# Patient Record
Sex: Female | Born: 1955 | Race: White | Hispanic: No | Marital: Married | Smoking: Current every day smoker
Health system: Southern US, Community
[De-identification: ages and names within clinical notes are randomized; demographics above are authoritative.]

## PROBLEM LIST (undated history)

## (undated) DIAGNOSIS — I10 Essential (primary) hypertension: Secondary | ICD-10-CM

## (undated) DIAGNOSIS — M1990 Unspecified osteoarthritis, unspecified site: Secondary | ICD-10-CM

## (undated) DIAGNOSIS — Z972 Presence of dental prosthetic device (complete) (partial): Secondary | ICD-10-CM

## (undated) DIAGNOSIS — K219 Gastro-esophageal reflux disease without esophagitis: Secondary | ICD-10-CM

## (undated) DIAGNOSIS — R112 Nausea with vomiting, unspecified: Secondary | ICD-10-CM

## (undated) DIAGNOSIS — F419 Anxiety disorder, unspecified: Secondary | ICD-10-CM

## (undated) DIAGNOSIS — Z9889 Other specified postprocedural states: Secondary | ICD-10-CM

## (undated) DIAGNOSIS — J189 Pneumonia, unspecified organism: Secondary | ICD-10-CM

## (undated) DIAGNOSIS — Z973 Presence of spectacles and contact lenses: Secondary | ICD-10-CM

## (undated) HISTORY — PX: ABDOMINAL HYSTERECTOMY: SHX81

## (undated) HISTORY — PX: DILATION AND CURETTAGE OF UTERUS: SHX78

## (undated) HISTORY — PX: TUBAL LIGATION: SHX77

## (undated) HISTORY — PX: OTHER SURGICAL HISTORY: SHX169

---

## 2005-08-15 ENCOUNTER — Other Ambulatory Visit: Payer: Self-pay

## 2005-08-15 ENCOUNTER — Emergency Department: Payer: Self-pay | Admitting: Unknown Physician Specialty

## 2005-08-28 ENCOUNTER — Ambulatory Visit: Payer: Self-pay | Admitting: Internal Medicine

## 2005-08-28 ENCOUNTER — Ambulatory Visit: Payer: Self-pay | Admitting: Unknown Physician Specialty

## 2006-08-23 ENCOUNTER — Ambulatory Visit: Payer: Self-pay | Admitting: Internal Medicine

## 2006-09-10 ENCOUNTER — Ambulatory Visit: Payer: Self-pay | Admitting: Internal Medicine

## 2006-09-13 ENCOUNTER — Ambulatory Visit: Payer: Self-pay | Admitting: Internal Medicine

## 2007-09-17 ENCOUNTER — Ambulatory Visit: Payer: Self-pay | Admitting: General Surgery

## 2009-09-16 ENCOUNTER — Ambulatory Visit: Payer: Self-pay | Admitting: Internal Medicine

## 2009-09-21 ENCOUNTER — Ambulatory Visit: Payer: Self-pay | Admitting: Internal Medicine

## 2011-06-22 ENCOUNTER — Ambulatory Visit: Payer: Self-pay

## 2011-09-06 ENCOUNTER — Ambulatory Visit: Payer: Self-pay | Admitting: Medical

## 2014-01-29 ENCOUNTER — Ambulatory Visit: Payer: Self-pay | Admitting: Internal Medicine

## 2016-04-26 DIAGNOSIS — M6240 Contracture of muscle, unspecified site: Secondary | ICD-10-CM | POA: Diagnosis not present

## 2016-04-26 DIAGNOSIS — K820 Obstruction of gallbladder: Secondary | ICD-10-CM | POA: Diagnosis not present

## 2016-04-26 DIAGNOSIS — J399 Disease of upper respiratory tract, unspecified: Secondary | ICD-10-CM | POA: Diagnosis not present

## 2016-08-09 DIAGNOSIS — K219 Gastro-esophageal reflux disease without esophagitis: Secondary | ICD-10-CM | POA: Diagnosis not present

## 2016-08-09 DIAGNOSIS — G629 Polyneuropathy, unspecified: Secondary | ICD-10-CM | POA: Diagnosis not present

## 2016-08-09 DIAGNOSIS — Z87891 Personal history of nicotine dependence: Secondary | ICD-10-CM | POA: Diagnosis not present

## 2016-09-13 DIAGNOSIS — K2960 Other gastritis without bleeding: Secondary | ICD-10-CM | POA: Diagnosis not present

## 2016-09-13 DIAGNOSIS — Z87891 Personal history of nicotine dependence: Secondary | ICD-10-CM | POA: Diagnosis not present

## 2016-12-13 DIAGNOSIS — K2960 Other gastritis without bleeding: Secondary | ICD-10-CM | POA: Diagnosis not present

## 2016-12-13 DIAGNOSIS — H6690 Otitis media, unspecified, unspecified ear: Secondary | ICD-10-CM | POA: Diagnosis not present

## 2017-10-03 DIAGNOSIS — M79604 Pain in right leg: Secondary | ICD-10-CM | POA: Diagnosis not present

## 2017-10-03 DIAGNOSIS — M79605 Pain in left leg: Secondary | ICD-10-CM | POA: Diagnosis not present

## 2017-10-03 DIAGNOSIS — G2581 Restless legs syndrome: Secondary | ICD-10-CM | POA: Diagnosis not present

## 2017-10-18 DIAGNOSIS — G2581 Restless legs syndrome: Secondary | ICD-10-CM | POA: Diagnosis not present

## 2017-10-18 DIAGNOSIS — M79605 Pain in left leg: Secondary | ICD-10-CM | POA: Diagnosis not present

## 2017-10-18 DIAGNOSIS — M79604 Pain in right leg: Secondary | ICD-10-CM | POA: Diagnosis not present

## 2017-10-29 DIAGNOSIS — J0100 Acute maxillary sinusitis, unspecified: Secondary | ICD-10-CM | POA: Diagnosis not present

## 2017-10-29 DIAGNOSIS — J069 Acute upper respiratory infection, unspecified: Secondary | ICD-10-CM | POA: Diagnosis not present

## 2017-10-29 DIAGNOSIS — R05 Cough: Secondary | ICD-10-CM | POA: Diagnosis not present

## 2017-11-05 DIAGNOSIS — M79604 Pain in right leg: Secondary | ICD-10-CM | POA: Diagnosis not present

## 2017-11-05 DIAGNOSIS — G2581 Restless legs syndrome: Secondary | ICD-10-CM | POA: Diagnosis not present

## 2017-11-05 DIAGNOSIS — M79605 Pain in left leg: Secondary | ICD-10-CM | POA: Diagnosis not present

## 2018-03-17 DIAGNOSIS — Z87891 Personal history of nicotine dependence: Secondary | ICD-10-CM | POA: Diagnosis not present

## 2018-03-17 DIAGNOSIS — E669 Obesity, unspecified: Secondary | ICD-10-CM | POA: Diagnosis not present

## 2018-03-17 DIAGNOSIS — J019 Acute sinusitis, unspecified: Secondary | ICD-10-CM | POA: Diagnosis not present

## 2019-06-19 ENCOUNTER — Encounter: Payer: Self-pay | Admitting: Internal Medicine

## 2019-06-19 ENCOUNTER — Other Ambulatory Visit: Payer: Self-pay

## 2019-06-19 ENCOUNTER — Ambulatory Visit (INDEPENDENT_AMBULATORY_CARE_PROVIDER_SITE_OTHER): Payer: 59 | Admitting: Internal Medicine

## 2019-06-19 VITALS — BP 151/78 | HR 90 | Wt 155.5 lb

## 2019-06-19 DIAGNOSIS — F419 Anxiety disorder, unspecified: Secondary | ICD-10-CM | POA: Diagnosis not present

## 2019-06-19 DIAGNOSIS — K2100 Gastro-esophageal reflux disease with esophagitis, without bleeding: Secondary | ICD-10-CM | POA: Diagnosis not present

## 2019-06-19 MED ORDER — CLONAZEPAM 0.5 MG PO TABS: 0.5000 mg | ORAL_TABLET | Freq: Two times a day (BID) | ORAL | 0 refills | Status: DC | PRN

## 2019-06-19 NOTE — Progress Notes (Addendum)
Established Patient Office Visit  Subjective:  Patient ID: Caitlin Oneal, female    DOB: 1955/11/12  Age: 64 y.o. MRN: [id number]  CC:  Chief Complaint  Patient presents with  . Abdominal Pain    Pt feels stretching and burning in her abdomen usually when she coughs and sneezes    Abdominal Pain This is a recurrent problem. The current episode started 1 to 4 weeks ago. The onset quality is undetermined. The problem occurs daily. The problem has been waxing and waning. The pain is located in the epigastric region. The pain is at a severity of 6/10. Pertinent negatives include no anorexia or arthralgias. The pain is aggravated by belching. The pain is relieved by nothing.    Caitlin Oneal presents for abdominal pain  History reviewed. No pertinent past medical history.  History reviewed. No pertinent surgical history.  History reviewed. No pertinent family history.  Social History   Socioeconomic History  . Marital status: Married    Spouse name: Not on file  . Number of children: Not on file  . Years of education: Not on file  . Highest education level: Not on file  Occupational History  . Not on file  Tobacco Use  . Smoking status: Current Every Day Smoker  . Smokeless tobacco: Current User  Substance and Sexual Activity  . Alcohol use: Not on file  . Drug use: Not on file  . Sexual activity: Not on file  Other Topics Concern  . Not on file  Social History Narrative  . Not on file   Social Determinants of Health   Financial Resource Strain:   . Difficulty of Paying Living Expenses:   Food Insecurity:   . Worried About Charity fundraiser in the Last Year:   . Arboriculturist in the Last Year:   Transportation Needs:   . Film/video editor (Medical):   Marland Kitchen Lack of Transportation (Non-Medical):   Physical Activity:   . Days of Exercise per Week:   . Minutes of Exercise per Session:   Stress:   . Feeling of Stress :   Social Connections:   . Frequency of  Communication with Friends and Family:   . Frequency of Social Gatherings with Friends and Family:   . Attends Religious Services:   . Active Member of Clubs or Organizations:   . Attends Archivist Meetings:   Marland Kitchen Marital Status:   Intimate Partner Violence:   . Fear of Current or Ex-Partner:   . Emotionally Abused:   Marland Kitchen Physically Abused:   . Sexually Abused:      Current Outpatient Medications:  .  aspirin EC 81 MG tablet, Take 81 mg by mouth daily., Disp: , Rfl:  .  clonazePAM (KLONOPIN) 0.5 MG tablet, Take 1 tablet (0.5 mg total) by mouth 2 (two) times daily as needed for anxiety., Disp: 60 tablet, Rfl: 0 .  gabapentin (NEURONTIN) 100 MG capsule, Take 100 mg by mouth 3 (three) times daily as needed., Disp: , Rfl:  .  Methylcobalamin (B12-ACTIVE PO), Take 1 tablet by mouth daily., Disp: , Rfl:    Allergies  Allergen Reactions  . Codeine     ROS Review of Systems  Constitutional: Negative for appetite change.  HENT: Negative.   Eyes: Negative.   Respiratory: Negative.   Cardiovascular: Negative.   Gastrointestinal: Positive for abdominal pain. Negative for anorexia.  Genitourinary: Negative.   Musculoskeletal: Negative for arthralgias.  Neurological: Negative.  Objective:    Physical Exam  Constitutional: She appears well-developed and well-nourished.  HENT:  Head: Normocephalic and atraumatic.  Eyes: Pupils are equal, round, and reactive to light.  Cardiovascular: Normal rate.  Abdominal: Soft. Bowel sounds are normal. There is no abdominal tenderness. There is no rebound.  Musculoskeletal:        General: No edema.     Cervical back: Normal range of motion and neck supple.  Skin: No erythema.    BP (!) 151/78   Pulse 90   Wt 155 lb 8 oz (70.5 kg)  Wt Readings from Last 3 Encounters:  06/19/19 155 lb 8 oz (70.5 kg)     Health Maintenance Due  Topic Date Due  . Hepatitis C Screening  Never done  . HIV Screening  Never done  .  TETANUS/TDAP  Never done  . PAP SMEAR-Modifier  Never done  . MAMMOGRAM  Never done  . COLONOSCOPY  Never done    There are no preventive care reminders to display for this patient.  No results found for: TSH No results found for: WBC, HGB, HCT, MCV, PLT No results found for: NA, K, CHLORIDE, CO2, GLUCOSE, BUN, CREATININE, BILITOT, ALKPHOS, AST, ALT, PROT, ALBUMIN, CALCIUM, ANIONGAP, EGFR, GFR No results found for: CHOL No results found for: HDL No results found for: LDLCALC No results found for: TRIG No results found for: CHOLHDL No results found for: HGBA1C    Assessment & Plan:   Problem List Items Addressed This Visit      Digestive   Gastroesophageal reflux disease with esophagitis without hemorrhage    Complaining of recurrent abdominal pain.  She is taking Prilosec twice a day I told her to take the evening Prilosec daily.  she should take her evening Prilosec before her evening meal avoid fatty meal.  Also she was advised to take Mylanta as needed        Other   Anxiety - Primary    This is a chronic problem but stable at the present time         Meds ordered this encounter  Medications  . clonazePAM (KLONOPIN) 0.5 MG tablet    Sig: Take 1 tablet (0.5 mg total) by mouth 2 (two) times daily as needed for anxiety.    Dispense:  60 tablet    Refill:  0   .jm Follow-up: Return in about 2 months (around 08/19/2019).    Cletis Athens, MD

## 2019-06-19 NOTE — Assessment & Plan Note (Signed)
Complaining of recurrent abdominal pain.  She is taking Prilosec twice a day I told her to take the evening Prilosec daily.  she should take her evening Prilosec before her evening meal avoid fatty meal.  Also she was advised to take Mylanta as needed

## 2019-06-23 NOTE — Assessment & Plan Note (Signed)
This is a chronic problem but stable at the present time

## 2019-06-26 ENCOUNTER — Telehealth: Payer: Self-pay | Admitting: *Deleted

## 2019-06-26 DIAGNOSIS — K2100 Gastro-esophageal reflux disease with esophagitis, without bleeding: Secondary | ICD-10-CM

## 2019-06-26 NOTE — Telephone Encounter (Signed)
Patient was seen in the office last week with bloating and burning in the abdomen and was told to call back if she didn't start feeling any better and we would put in referral for GI. I have placed the order for the referral and patient has been notified.

## 2019-07-16 ENCOUNTER — Other Ambulatory Visit: Payer: Self-pay

## 2019-07-16 ENCOUNTER — Ambulatory Visit (INDEPENDENT_AMBULATORY_CARE_PROVIDER_SITE_OTHER): Payer: 59 | Admitting: Gastroenterology

## 2019-07-16 ENCOUNTER — Encounter: Payer: Self-pay | Admitting: Gastroenterology

## 2019-07-16 VITALS — BP 157/51 | HR 94 | Temp 96.9°F | Ht 68.0 in | Wt 155.4 lb

## 2019-07-16 DIAGNOSIS — K2100 Gastro-esophageal reflux disease with esophagitis, without bleeding: Secondary | ICD-10-CM | POA: Diagnosis not present

## 2019-07-16 DIAGNOSIS — R1033 Periumbilical pain: Secondary | ICD-10-CM

## 2019-07-16 DIAGNOSIS — Z86010 Personal history of colonic polyps: Secondary | ICD-10-CM

## 2019-07-16 NOTE — Progress Notes (Signed)
Gastroenterology Consultation  Referring Provider:     Corky Downs, MD Primary Care Physician:  Corky Downs, MD Primary Gastroenterologist:  Dr. Servando Snare     Reason for Consultation:     Abdominal pain        HPI:   Caitlin Oneal is a 64 y.o. y/o female referred for consultation & management of abdominal pain by Dr. Corky Downs, MD.  This patient comes in today with a history of having an upper endoscopy back in 1999 at [facility] for noncardiac chest pain.  At that time the patient had biopsies of the esophagus that showed reflux.  The patient was seen at the beginning of June by her primary care provider and at that time the patient had reported a few weeks of epigastric discomfort that was off and on and was rated as a 6 out of 10.  The pain was described as burning in nature. The patient reports that her abdominal pain started a few months ago when she was straining to have a bowel movement.  The pain is worse with eating but she does not report any particular food make it worse.  She also has bloating associated with the pain.  There is no report of any unexplained weight loss nausea vomiting fevers or chills.  The patient has been on a PPI and states that as long as she takes her medication she does not suffer from any heartburn. She reports the pain to be in the right lower abdomen adjacent to her umbilicus.  She also reports that the characteristics of the pain is a burning pain. The patient also reports that she has had longstanding back pain.  The patient had a CT scan of the abdomen in September 2011 for similar pain without any source of the pain seen at that time.  She did have a ventral hernia which contains fat in the area superior to the umbilicus that time.  History reviewed. No pertinent past medical history.  History reviewed. No pertinent surgical history.  Prior to Admission medications   Medication Sig Start Date End Date Taking? Authorizing Provider  aspirin EC 81 MG tablet  Take 81 mg by mouth daily.    [provider]  clonazePAM (KLONOPIN) 0.5 MG tablet Take 1 tablet (0.5 mg total) by mouth 2 (two) times daily as needed for anxiety. 06/19/19   Corky Downs, MD  gabapentin (NEURONTIN) 100 MG capsule Take 100 mg by mouth 3 (three) times daily as needed. 05/15/19   [provider]  Methylcobalamin (B12-ACTIVE PO) Take 1 tablet by mouth daily.    [provider]  omeprazole (PRILOSEC) 20 MG capsule Take 20 mg by mouth 2 (two) times daily. 07/09/19   [provider]    History reviewed. No pertinent family history.   Social History   Tobacco Use  . Smoking status: Current Every Day Smoker  . Smokeless tobacco: Current User  Substance Use Topics  . Alcohol use: Not on file  . Drug use: Not on file    Allergies as of 07/16/2019 - Review Complete 07/16/2019  Allergen Reaction Noted  . Codeine  06/19/2019    Review of Systems:    All systems reviewed and negative except where noted in HPI.   Physical Exam:  BP (!) 157/51   Pulse 94   Temp (!) 96.9 F (36.1 C) (Temporal)   Ht 5\' 8"  (1.727 m)   Wt 155 lb 6.4 oz (70.5 kg)   BMI 23.63  kg/m  No LMP recorded. General:   Alert,  Well-developed, well-nourished, pleasant and cooperative in NAD Head:  Normocephalic and atraumatic. Eyes:  Sclera clear, no icterus.   Conjunctiva pink. Ears:  Normal auditory acuity. Neck:  Supple; no masses or thyromegaly. Lungs:  Respirations even and unlabored.  Clear throughout to auscultation.   No wheezes, crackles, or rhonchi. No acute distress. Heart:  Regular rate and rhythm; no murmurs, clicks, rubs, or gallops. Abdomen:  Normal bowel sounds.  No bruits.  Soft, positive tenderness to palpation while flexing the abdominal wall muscles and non-distended without masses or hepatosplenomegaly.  A periumbilical hernia was noted.  No guarding or rebound tenderness.  Positive Carnett sign.   Rectal:  Deferred.  Pulses:  Normal pulses  noted. Extremities:  No clubbing or edema.  No cyanosis. Neurologic:  Alert and oriented x3;  grossly normal neurologically. Skin:  Intact without significant lesions or rashes.  No jaundice. Lymph Nodes:  No significant cervical adenopathy. Psych:  Alert and cooperative. Normal mood and affect.  Imaging Studies: No results found.  Assessment and Plan:   Caitlin Oneal is a 64 y.o. y/o female comes in today with a history of a colon polyp and she reports that that was done at [facility] endoscopy center and she was told that she had polyps and would need a repeat colonoscopy in 5 years.  The patient will be set up for repeat colonoscopy.  The patient reports that her PPI is helping her heartburn and she is having no further symptoms of heartburn and she has been encouraged to continue taking her PPI.  As for the abdominal pain on physical exam it is consistent with musculoskeletal pain with exacerbation of the abdominal discomfort with raising the leg 6 inches above the exam table.  The patient has been explained this and has been told to use warm compresses to the area with massaging the area.  The patient has also been told that this musculoskeletal pain is more common in people with chronic back pain which she has.  The patient has been explained the plan and agrees with it and will follow the time of the colonoscopy.    Midge Minium, MD. Clementeen Graham    Note: This dictation was prepared with Dragon dictation along with smaller phrase technology. Any transcriptional errors that result from this process are unintentional.

## 2019-08-20 ENCOUNTER — Ambulatory Visit: Payer: 59 | Admitting: Internal Medicine

## 2019-08-25 ENCOUNTER — Other Ambulatory Visit: Payer: Self-pay

## 2019-08-25 ENCOUNTER — Encounter: Payer: Self-pay | Admitting: Gastroenterology

## 2019-08-27 ENCOUNTER — Other Ambulatory Visit
Admission: RE | Admit: 2019-08-27 | Discharge: 2019-08-27 | Disposition: A | Discharge status: Home / Self Care | Payer: 59 | Source: Ambulatory Visit | Attending: Gastroenterology | Admitting: Gastroenterology

## 2019-08-27 ENCOUNTER — Other Ambulatory Visit: Payer: Self-pay

## 2019-08-27 DIAGNOSIS — Z01812 Encounter for preprocedural laboratory examination: Secondary | ICD-10-CM | POA: Insufficient documentation

## 2019-08-27 DIAGNOSIS — Z20822 Contact with and (suspected) exposure to covid-19: Secondary | ICD-10-CM | POA: Insufficient documentation

## 2019-08-27 LAB — SARS CORONAVIRUS 2 (TAT 6-24 HRS): SARS Coronavirus 2: NEGATIVE

## 2019-08-28 NOTE — Anesthesia Preprocedure Evaluation (Addendum)
Anesthesia Evaluation  Patient identified by MRN, date of birth, ID band Patient awake    Reviewed: Allergy & Precautions, NPO status , Patient's Chart, lab work & pertinent test results, reviewed documented beta blocker date and time   History of Anesthesia Complications (+) PONV and history of anesthetic complications  Airway Mallampati: III  TM Distance: >3 FB Neck ROM: Full    Dental  (+) Upper Dentures, Partial Lower   Pulmonary Current SmokerPatient did not abstain from smoking.,    breath sounds clear to auscultation       Cardiovascular (-) angina(-) DOE  Rhythm:Regular Rate:Normal     Neuro/Psych Anxiety    GI/Hepatic GERD  Medicated and Controlled,  Endo/Other    Renal/GU      Musculoskeletal   Abdominal   Peds  Hematology   Anesthesia Other Findings   Reproductive/Obstetrics                            Anesthesia Physical Anesthesia Plan  ASA: II  Anesthesia Plan: General   Post-op Pain Management:    Induction: Intravenous  PONV Risk Score and Plan: 3 and Propofol infusion, TIVA, Treatment may vary due to age or medical condition and Ondansetron  Airway Management Planned: Natural Airway and Nasal Cannula  Additional Equipment:   Intra-op Plan:   Post-operative Plan:   Informed Consent: I have reviewed the patients History and Physical, chart, labs and discussed the procedure including the risks, benefits and alternatives for the proposed anesthesia with the patient or authorized representative who has indicated his/her understanding and acceptance.       Plan Discussed with: CRNA and Anesthesiologist  Anesthesia Plan Comments:         Anesthesia Quick Evaluation

## 2019-08-28 NOTE — Discharge Instructions (Signed)
General Anesthesia, Adult, Care After This sheet gives you information about how to care for yourself after your procedure. Your health care provider may also give you more specific instructions. If you have problems or questions, contact your health care provider. What can I expect after the procedure? After the procedure, the following side effects are common:  Pain or discomfort at the IV site.  Nausea.  Vomiting.  Sore throat.  Trouble concentrating.  Feeling cold or chills.  Weak or tired.  Sleepiness and fatigue.  Soreness and body aches. These side effects can affect parts of the body that were not involved in surgery. Follow these instructions at home:  For at least 24 hours after the procedure:  Have a responsible adult stay with you. It is important to have someone help care for you until you are awake and alert.  Rest as needed.  Do not: ? Participate in activities in which you could fall or become injured. ? Drive. ? Use heavy machinery. ? Drink alcohol. ? Take sleeping pills or medicines that cause drowsiness. ? Make important decisions or sign legal documents. ? Take care of children on your own. Eating and drinking  Follow any instructions from your health care provider about eating or drinking restrictions.  When you feel hungry, start by eating small amounts of foods that are soft and easy to digest (bland), such as toast. Gradually return to your regular diet.  Drink enough fluid to keep your urine pale yellow.  If you vomit, rehydrate by drinking water, juice, or clear broth. General instructions  If you have sleep apnea, surgery and certain medicines can increase your risk for breathing problems. Follow instructions from your health care provider about wearing your sleep device: ? Anytime you are sleeping, including during daytime naps. ? While taking prescription pain medicines, sleeping medicines, or medicines that make you drowsy.  Return to  your normal activities as told by your health care provider. Ask your health care provider what activities are safe for you.  Take over-the-counter and prescription medicines only as told by your health care provider.  If you smoke, do not smoke without supervision.  Keep all follow-up visits as told by your health care provider. This is important. Contact a health care provider if:  You have nausea or vomiting that does not get better with medicine.  You cannot eat or drink without vomiting.  You have pain that does not get better with medicine.  You are unable to pass urine.  You develop a skin rash.  You have a fever.  You have redness around your IV site that gets worse. Get help right away if:  You have difficulty breathing.  You have chest pain.  You have blood in your urine or stool, or you vomit blood. Summary  After the procedure, it is common to have a sore throat or nausea. It is also common to feel tired.  Have a responsible adult stay with you for the first 24 hours after general anesthesia. It is important to have someone help care for you until you are awake and alert.  When you feel hungry, start by eating small amounts of foods that are soft and easy to digest (bland), such as toast. Gradually return to your regular diet.  Drink enough fluid to keep your urine pale yellow.  Return to your normal activities as told by your health care provider. Ask your health care provider what activities are safe for you. This information is not   intended to replace advice given to you by your health care provider. Make sure you discuss any questions you have with your health care provider. Document Revised: 01/04/2017 Document Reviewed: 08/17/2016 Elsevier Patient Education  2020 Elsevier Inc[employer]  

## 2019-08-31 ENCOUNTER — Ambulatory Visit
Admission: RE | Admit: 2019-08-31 | Discharge: 2019-08-31 | Disposition: A | Discharge status: Home / Self Care | Payer: 59 | Attending: Gastroenterology | Admitting: Gastroenterology

## 2019-08-31 ENCOUNTER — Ambulatory Visit: Payer: 59 | Admitting: Anesthesiology

## 2019-08-31 ENCOUNTER — Other Ambulatory Visit: Payer: Self-pay | Admitting: Gastroenterology

## 2019-08-31 ENCOUNTER — Encounter
Admission: RE | Disposition: A | Discharge status: Home / Self Care | Payer: Self-pay | Source: Home / Self Care | Attending: Gastroenterology

## 2019-08-31 ENCOUNTER — Encounter: Payer: Self-pay | Admitting: Gastroenterology

## 2019-08-31 ENCOUNTER — Other Ambulatory Visit: Payer: Self-pay

## 2019-08-31 DIAGNOSIS — K640 First degree hemorrhoids: Secondary | ICD-10-CM | POA: Insufficient documentation

## 2019-08-31 DIAGNOSIS — Z860100 Personal history of colon polyps, unspecified: Secondary | ICD-10-CM

## 2019-08-31 DIAGNOSIS — K635 Polyp of colon: Secondary | ICD-10-CM

## 2019-08-31 DIAGNOSIS — F17210 Nicotine dependence, cigarettes, uncomplicated: Secondary | ICD-10-CM | POA: Diagnosis not present

## 2019-08-31 DIAGNOSIS — K219 Gastro-esophageal reflux disease without esophagitis: Secondary | ICD-10-CM | POA: Insufficient documentation

## 2019-08-31 DIAGNOSIS — K648 Other hemorrhoids: Secondary | ICD-10-CM

## 2019-08-31 DIAGNOSIS — Z1211 Encounter for screening for malignant neoplasm of colon: Secondary | ICD-10-CM | POA: Diagnosis present

## 2019-08-31 DIAGNOSIS — F419 Anxiety disorder, unspecified: Secondary | ICD-10-CM | POA: Diagnosis not present

## 2019-08-31 HISTORY — DX: Gastro-esophageal reflux disease without esophagitis: K21.9

## 2019-08-31 HISTORY — DX: Other specified postprocedural states: Z98.890

## 2019-08-31 HISTORY — PX: COLONOSCOPY WITH PROPOFOL: SHX5780

## 2019-08-31 HISTORY — PX: POLYPECTOMY: SHX5525

## 2019-08-31 HISTORY — DX: Presence of spectacles and contact lenses: Z97.3

## 2019-08-31 HISTORY — DX: Presence of dental prosthetic device (complete) (partial): Z97.2

## 2019-08-31 HISTORY — DX: Nausea with vomiting, unspecified: R11.2

## 2019-08-31 SURGERY — COLONOSCOPY WITH PROPOFOL
Anesthesia: General | Site: Rectum

## 2019-08-31 MED ORDER — SODIUM CHLORIDE 0.9 % IV SOLN: INTRAVENOUS | Status: DC

## 2019-08-31 MED ORDER — PROPOFOL 10 MG/ML IV BOLUS
INTRAVENOUS | Status: DC | PRN
  Administered 2019-08-31: 50 mg via INTRAVENOUS
  Administered 2019-08-31 (×2): 30 mg via INTRAVENOUS
  Administered 2019-08-31: 150 mg via INTRAVENOUS
  Administered 2019-08-31 (×2): 30 mg via INTRAVENOUS

## 2019-08-31 MED ORDER — ONDANSETRON HCL 4 MG/2ML IJ SOLN: 4.0000 mg | Freq: Once | INTRAMUSCULAR | Status: DC | PRN

## 2019-08-31 MED ORDER — LIDOCAINE HCL (CARDIAC) PF 100 MG/5ML IV SOSY
PREFILLED_SYRINGE | INTRAVENOUS | Status: DC | PRN
  Administered 2019-08-31: 30 mg via INTRAVENOUS

## 2019-08-31 MED ORDER — ACETAMINOPHEN 10 MG/ML IV SOLN: 1000.0000 mg | Freq: Once | INTRAVENOUS | Status: DC | PRN

## 2019-08-31 MED ORDER — STERILE WATER FOR IRRIGATION IR SOLN: Status: DC | PRN

## 2019-08-31 MED ORDER — LACTATED RINGERS IV SOLN: INTRAVENOUS | Status: DC

## 2019-08-31 SURGICAL SUPPLY — 7 items
GOWN CVR UNV OPN BCK APRN NK (MISCELLANEOUS) ×2 IMPLANT
GOWN ISOL THUMB LOOP REG UNIV (MISCELLANEOUS) ×4
KIT ENDO PROCEDURE OLY (KITS) ×2 IMPLANT
MANIFOLD NEPTUNE II (INSTRUMENTS) ×2 IMPLANT
SNARE SHORT THROW 13M SML OVAL (MISCELLANEOUS) ×2 IMPLANT
TRAP ETRAP POLY (MISCELLANEOUS) ×2 IMPLANT
WATER STERILE IRR 250ML POUR (IV SOLUTION) ×2 IMPLANT

## 2019-08-31 NOTE — Transfer of Care (Signed)
Immediate Anesthesia Transfer of Care Note  Patient: Caitlin Oneal  Procedure(s) Performed: COLONOSCOPY WITH BIOPSY (N/A Rectum) POLYPECTOMY (N/A Rectum)  Patient Location: PACU  Anesthesia Type: General  Level of Consciousness: awake, alert  and patient cooperative  Airway and Oxygen Therapy: Patient Spontanous Breathing and Patient connected to supplemental oxygen  Post-op Assessment: Post-op Vital signs reviewed, Patient's Cardiovascular Status Stable, Respiratory Function Stable, Patent Airway and No signs of Nausea or vomiting  Post-op Vital Signs: Reviewed and stable  Complications: No complications documented.

## 2019-08-31 NOTE — H&P (Signed)
Midge Minium, MD [facility] [address], [address] [place], [place] [address] Phone:[phone] Fax : [phone]  Primary Care Physician:  Corky Downs, MD Primary Gastroenterologist:  Dr. Servando Snare  Pre-Procedure History & Physical: HPI:  Caitlin Oneal is a 64 y.o. female is here for an colonoscopy.   Past Medical History:  Diagnosis Date  . GERD (gastroesophageal reflux disease)   . PONV (postoperative nausea and vomiting)    after EGD  . Wears contact lenses   . Wears dentures    Full upper, partial lower    History reviewed. No pertinent surgical history.  Prior to Admission medications   Medication Sig Start Date End Date Taking? Authorizing Provider  clonazePAM (KLONOPIN) 0.5 MG tablet Take 1 tablet (0.5 mg total) by mouth 2 (two) times daily as needed for anxiety. 06/19/19  Yes Masoud, Renda Rolls, MD  gabapentin (NEURONTIN) 100 MG capsule Take 100 mg by mouth 3 (three) times daily as needed. 05/15/19  Yes [provider]  Methylcobalamin (B12-ACTIVE PO) Take 1 tablet by mouth daily.   Yes [provider]  omeprazole (PRILOSEC) 20 MG capsule Take 20 mg by mouth 2 (two) times daily. 07/09/19  Yes [provider]    Allergies as of 07/16/2019 - Review Complete 07/16/2019  Allergen Reaction Noted  . Codeine  06/19/2019    History reviewed. No pertinent family history.  Social History   Socioeconomic History  . Marital status: Married    Spouse name: Not on file  . Number of children: Not on file  . Years of education: Not on file  . Highest education level: Not on file  Occupational History  . Not on file  Tobacco Use  . Smoking status: Current Every Day Smoker    Packs/day: 1.00    Years: 45.00    Pack years: 45.00    Types: Cigarettes  . Smokeless tobacco: Never Used  . Tobacco comment: started age 37  Vaping Use  . Vaping Use: Never used  Substance and Sexual Activity  . Alcohol use: Yes    Alcohol/week: 1.0 standard drink    Types: 1  Cans of beer per week  . Drug use: Not on file  . Sexual activity: Not on file  Other Topics Concern  . Not on file  Social History Narrative  . Not on file   Social Determinants of Health   Financial Resource Strain:   . Difficulty of Paying Living Expenses:   Food Insecurity:   . Worried About Programme researcher, broadcasting/film/video in the Last Year:   . Barista in the Last Year:   Transportation Needs:   . Freight forwarder (Medical):   Marland Kitchen Lack of Transportation (Non-Medical):   Physical Activity:   . Days of Exercise per Week:   . Minutes of Exercise per Session:   Stress:   . Feeling of Stress :   Social Connections:   . Frequency of Communication with Friends and Family:   . Frequency of Social Gatherings with Friends and Family:   . Attends Religious Services:   . Active Member of Clubs or Organizations:   . Attends Banker Meetings:   Marland Kitchen Marital Status:   Intimate Partner Violence:   . Fear of Current or Ex-Partner:   . Emotionally Abused:   Marland Kitchen Physically Abused:   . Sexually Abused:     Review of Systems: See HPI, otherwise negative ROS  Physical Exam: BP (!) 162/72   Pulse 86  Temp 97.9 F (36.6 C) (Temporal)   Resp 16   Ht 5\' 8"  (1.727 m)   Wt 69.5 kg   SpO2 99%   BMI 23.29 kg/m  General:   Alert,  pleasant and cooperative in NAD Head:  Normocephalic and atraumatic. Neck:  Supple; no masses or thyromegaly. Lungs:  Clear throughout to auscultation.    Heart:  Regular rate and rhythm. Abdomen:  Soft, nontender and nondistended. Normal bowel sounds, without guarding, and without rebound.   Neurologic:  Alert and  oriented x4;  grossly normal neurologically.  Impression/Plan: is here for an colonoscopy to be performed for a history of adenomatous polyps on 2013   Risks, benefits, limitations, and alternatives regarding  colonoscopy have been reviewed with the patient.  Questions have been answered.  All parties  agreeable.   2014, MD  08/31/2019, 9:39 AM

## 2019-08-31 NOTE — Anesthesia Procedure Notes (Signed)
Date/Time: 08/31/2019 9:48 AM Performed by: Maree Krabbe, CRNA Pre-anesthesia Checklist: Patient identified, Emergency Drugs available, Suction available, Timeout performed and Patient being monitored Patient Re-evaluated:Patient Re-evaluated prior to induction Oxygen Delivery Method: Nasal cannula Placement Confirmation: positive ETCO2

## 2019-08-31 NOTE — Anesthesia Postprocedure Evaluation (Signed)
Anesthesia Post Note  Patient: Caitlin Oneal  Procedure(s) Performed: COLONOSCOPY WITH BIOPSY (N/A Rectum) POLYPECTOMY (N/A Rectum)     Patient location during evaluation: PACU Anesthesia Type: General Level of consciousness: awake and alert Pain management: pain level controlled Vital Signs Assessment: post-procedure vital signs reviewed and stable Respiratory status: spontaneous breathing, nonlabored ventilation, respiratory function stable and patient connected to nasal cannula oxygen Cardiovascular status: blood pressure returned to baseline and stable Postop Assessment: no apparent nausea or vomiting Anesthetic complications: no   No complications documented.  Trinda Harlacher A  Lyndel Sarate

## 2019-08-31 NOTE — Op Note (Addendum)
[facility]astroenterology Patient Name: Jonice Cerra Procedure Date: 08/31/2019 9:45 AM MRN: [id number] Account #: [id number] Date of Birth: Feb 14, 1955 Admit Type: Outpatient Age: 64 Room: [employer] OR ROOM 01 Gender: Female Note Status: Finalized Procedure:             Colonoscopy Indications:           Screening for colorectal malignant neoplasm, High risk                         colon cancer surveillance: Personal history of colonic                         polyps Providers:             Midge Minium MD, MD Referring MD:          Corky Downs, MD (Referring MD) Medicines:             Propofol per Anesthesia Complications:         No immediate complications. Procedure:             Pre-Anesthesia Assessment:                        - Prior to the procedure, a History and Physical was                         performed, and patient medications and allergies were                         reviewed. The patient's tolerance of previous                         anesthesia was also reviewed. The risks and benefits                         of the procedure and the sedation options and risks                         were discussed with the patient. All questions were                         answered, and informed consent was obtained. Prior                         Anticoagulants: The patient has taken no previous                         anticoagulant or antiplatelet agents. ASA Grade                         Assessment: II - A patient with mild systemic disease.                         After reviewing the risks and benefits, the patient                         was deemed in satisfactory condition to undergo the  procedure.                        After obtaining informed consent, the colonoscope was                         passed under direct vision. Throughout the procedure,                         the patient's blood pressure, pulse, and oxygen                          saturations were monitored continuously. The was                         introduced through the anus and advanced to the the                         cecum, identified by appendiceal orifice and ileocecal                         valve. The colonoscopy was performed without                         difficulty. The patient tolerated the procedure well.                         The quality of the bowel preparation was excellent. Findings:      The perianal and digital rectal examinations were normal.      A 6 mm polyp was found in the descending colon. The polyp was sessile.       The polyp was removed with a cold snare. Resection and retrieval were       complete.      Four sessile polyps were found in the sigmoid colon. The polyps were 1       to 5 mm in size. These polyps were removed with a cold snare. Resection       and retrieval were complete.      Non-bleeding internal hemorrhoids were found during retroflexion. The       hemorrhoids were Grade I (internal hemorrhoids that do not prolapse). Impression:            - One 6 mm polyp in the descending colon, removed with                         a cold snare. Resected and retrieved.                        - Four 1 to 5 mm polyps in the sigmoid colon, removed                         with a cold snare. Resected and retrieved.                        - Non-bleeding internal hemorrhoids. Recommendation:        - Discharge patient to home.                        - Resume  previous diet.                        - Continue present medications.                        - Await pathology results.                        - Repeat colonoscopy in 5 years if polyp adenoma and                         10 years if hyperplastic Procedure Code(s):     --- Professional ---                        [id number], Colonoscopy, flexible; with removal of                         tumor(s), polyp(s), or other lesion(s) by snare                         technique Diagnosis  Code(s):     --- Professional ---                        Z12.11, Encounter for screening for malignant neoplasm                         of colon                        K63.5, Polyp of colon CPT copyright 2019 American Medical Association. All rights reserved. The codes documented in this report are preliminary and upon coder review may  be revised to meet current compliance requirements. Midge Minium MD, MD 08/31/2019 10:10:36 AM This report has been signed electronically. Number of Addenda: 0 Note Initiated On: 08/31/2019 9:45 AM Scope Withdrawal Time: 0 hours 14 minutes 27 seconds  Total Procedure Duration: 0 hours 16 minutes 50 seconds  Estimated Blood Loss:  Estimated blood loss: none.      [facility]

## 2019-09-01 ENCOUNTER — Encounter: Payer: Self-pay | Admitting: Gastroenterology

## 2019-09-02 LAB — SURGICAL PATHOLOGY

## 2019-09-03 ENCOUNTER — Encounter: Payer: Self-pay | Admitting: Gastroenterology

## 2019-09-15 ENCOUNTER — Encounter: Payer: Self-pay | Admitting: Gastroenterology

## 2019-10-08 ENCOUNTER — Other Ambulatory Visit: Payer: Self-pay

## 2019-10-08 MED ORDER — OMEPRAZOLE 20 MG PO CPDR: 20.0000 mg | DELAYED_RELEASE_CAPSULE | Freq: Two times a day (BID) | ORAL | 2 refills | Status: DC

## 2019-12-31 ENCOUNTER — Other Ambulatory Visit: Payer: Self-pay | Admitting: Internal Medicine

## 2020-02-10 ENCOUNTER — Other Ambulatory Visit: Payer: Self-pay | Admitting: *Deleted

## 2020-02-10 MED ORDER — GABAPENTIN 100 MG PO CAPS: 100.0000 mg | ORAL_CAPSULE | Freq: Three times a day (TID) | ORAL | 6 refills | Status: DC | PRN

## 2020-03-08 ENCOUNTER — Encounter: Payer: Self-pay | Admitting: Internal Medicine

## 2020-03-08 ENCOUNTER — Other Ambulatory Visit: Payer: Self-pay

## 2020-03-08 ENCOUNTER — Ambulatory Visit (INDEPENDENT_AMBULATORY_CARE_PROVIDER_SITE_OTHER): Payer: 59 | Admitting: Internal Medicine

## 2020-03-08 VITALS — BP 159/79 | HR 81 | Ht 67.0 in | Wt 155.9 lb

## 2020-03-08 DIAGNOSIS — R110 Nausea: Secondary | ICD-10-CM | POA: Diagnosis not present

## 2020-03-08 DIAGNOSIS — R140 Abdominal distension (gaseous): Secondary | ICD-10-CM | POA: Diagnosis not present

## 2020-03-08 DIAGNOSIS — F419 Anxiety disorder, unspecified: Secondary | ICD-10-CM

## 2020-03-08 DIAGNOSIS — K2100 Gastro-esophageal reflux disease with esophagitis, without bleeding: Secondary | ICD-10-CM | POA: Diagnosis not present

## 2020-03-08 NOTE — Assessment & Plan Note (Signed)
-   Patient experiencing high levels of anxiety.  - Encouraged patient to engage in relaxing activities like yoga, meditation, journaling, going for a walk, or participating in a hobby.  - Encouraged patient to reach out to trusted friends or family members about recent struggles 

## 2020-03-08 NOTE — Assessment & Plan Note (Signed)
We will check CBC and metabolic panel TSH and B12 level.  Also started the patient on SIBO diet she was advised to avoid onions and garlic  kidney beans and fried food

## 2020-03-08 NOTE — Progress Notes (Signed)
Established Patient Office Visit  Subjective:  Patient ID: Caitlin Oneal, female    DOB: Jul 08, 1955  Age: 65 y.o. MRN: [id number]  CC:  Chief Complaint  Patient presents with  . Fatigue    Patient reports constant fatigue and feels like her hernia is getting bigger.    HPI  Caitlin Oneal presents for patient complaining of increased gas and belching.  She cannot lose weight. Past Medical History:  Diagnosis Date  . GERD (gastroesophageal reflux disease)   . PONV (postoperative nausea and vomiting)    after EGD  . Wears contact lenses   . Wears dentures    Full upper, partial lower    Past Surgical History:  Procedure Laterality Date  . COLONOSCOPY WITH PROPOFOL N/A 08/31/2019   Procedure: COLONOSCOPY WITH BIOPSY;  Surgeon: Lucilla Lame, MD;  Location: [place];  Service: Endoscopy;  Laterality: N/A;  . POLYPECTOMY N/A 08/31/2019   Procedure: POLYPECTOMY;  Surgeon: Lucilla Lame, MD;  Location: [place];  Service: Endoscopy;  Laterality: N/A;    History reviewed. No pertinent family history.  Social History   Socioeconomic History  . Marital status: Married    Spouse name: Not on file  . Number of children: Not on file  . Years of education: Not on file  . Highest education level: Not on file  Occupational History  . Not on file  Tobacco Use  . Smoking status: Current Every Day Smoker    Packs/day: 1.00    Years: 45.00    Pack years: 45.00    Types: Cigarettes  . Smokeless tobacco: Never Used  . Tobacco comment: started age 46  Vaping Use  . Vaping Use: Never used  Substance and Sexual Activity  . Alcohol use: Yes    Alcohol/week: 1.0 standard drink    Types: 1 Cans of beer per week  . Drug use: Not on file  . Sexual activity: Not on file  Other Topics Concern  . Not on file  Social History Narrative  . Not on file   Social Determinants of Health   Financial Resource Strain: Not on file  Food Insecurity: Not on file   Transportation Needs: Not on file  Physical Activity: Not on file  Stress: Not on file  Social Connections: Not on file  Intimate Partner Violence: Not on file     Current Outpatient Medications:  .  clonazePAM (KLONOPIN) 0.5 MG tablet, Take 1 tablet (0.5 mg total) by mouth 2 (two) times daily as needed for anxiety., Disp: 60 tablet, Rfl: 0 .  gabapentin (NEURONTIN) 100 MG capsule, Take 1 capsule (100 mg total) by mouth 3 (three) times daily as needed., Disp: 90 capsule, Rfl: 6 .  Methylcobalamin (B12-ACTIVE PO), Take 1 tablet by mouth daily., Disp: , Rfl:  .  omeprazole (PRILOSEC) 20 MG capsule, TAKE 1 CAPSULE BY MOUTH TWICE A DAY, Disp: 180 capsule, Rfl: 1   Allergies  Allergen Reactions  . Codeine Nausea Only    ROS Review of Systems  Constitutional: Negative.   HENT: Negative.   Eyes: Negative.   Respiratory: Negative.   Cardiovascular: Negative.   Gastrointestinal: Positive for constipation and nausea. Negative for blood in stool.  Endocrine: Negative.   Genitourinary: Negative.  Negative for dysuria.  Musculoskeletal: Negative.   Skin: Negative.   Allergic/Immunologic: Negative.   Neurological: Negative.  Negative for seizures.  Hematological: Negative.   Psychiatric/Behavioral: Negative.  Negative for dysphoric mood. The patient is not nervous/anxious.  All other systems reviewed and are negative.     Objective:    Physical Exam Vitals reviewed.  Constitutional:      Appearance: Normal appearance.  HENT:     Mouth/Throat:     Mouth: Mucous membranes are moist.  Eyes:     Pupils: Pupils are equal, round, and reactive to light.  Neck:     Vascular: No carotid bruit.  Cardiovascular:     Rate and Rhythm: Normal rate and regular rhythm.     Pulses: Normal pulses.     Heart sounds: Normal heart sounds.  Pulmonary:     Effort: Pulmonary effort is normal.     Breath sounds: Normal breath sounds.  Abdominal:     General: Bowel sounds are normal.      Palpations: Abdomen is soft. There is no hepatomegaly, splenomegaly or mass.     Tenderness: There is no abdominal tenderness.     Hernia: No hernia is present.  Musculoskeletal:        General: No tenderness.     Cervical back: Neck supple.     Right lower leg: No edema.     Left lower leg: No edema.  Skin:    Findings: No rash.  Neurological:     Mental Status: She is alert and oriented to person, place, and time.     Motor: No weakness.  Psychiatric:        Mood and Affect: Mood and affect normal.        Behavior: Behavior normal.     BP (!) 159/79   Pulse 81   Ht _0  (1.702 m)   Wt 155 lb 14.4 oz (70.7 kg)   BMI 24.42 kg/m  Wt Readings from Last 3 Encounters:  03/08/20 155 lb 14.4 oz (70.7 kg)  08/31/19 153 lb 3.2 oz (69.5 kg)  07/16/19 155 lb 6.4 oz (70.5 kg)     Health Maintenance Due  Topic Date Due  . Hepatitis C Screening  Never done  . HIV Screening  Never done  . TETANUS/TDAP  Never done  . PAP SMEAR-Modifier  Never done  . MAMMOGRAM  Never done  . COVID-19 Vaccine (2 - Booster for [employer] series) 06/20/2019  . INFLUENZA VACCINE  Never done    There are no preventive care reminders to display for this patient.  No results found for: TSH No results found for: WBC, HGB, HCT, MCV, PLT No results found for: NA, K, CHLORIDE, CO2, GLUCOSE, BUN, CREATININE, BILITOT, ALKPHOS, AST, ALT, PROT, ALBUMIN, CALCIUM, ANIONGAP, EGFR, GFR No results found for: CHOL No results found for: HDL No results found for: LDLCALC No results found for: TRIG No results found for: CHOLHDL No results found for: HGBA1C    Assessment & Plan:   Problem List Items Addressed This Visit      Digestive   Gastroesophageal reflux disease with esophagitis without hemorrhage - Primary    - The patient's GERD is not stable on medication.  - Instructed the patient to avoid eating spicy and acidic foods, as well as foods high in fat. - Instructed the patient to avoid eating large meals  or meals 2-3 hours prior to sleeping.         Other   Anxiety    - Patient experiencing high levels of anxiety.  - Encouraged patient to engage in relaxing activities like yoga, meditation, journaling, going for a walk, or participating in a hobby.  - Encouraged patient to reach out to  trusted friends or family members about recent struggles       Bloating symptom    We will check CBC and metabolic panel TSH and M08 level.  Also started the patient on SIBO diet she was advised to avoid onions and garlic  kidney beans and fried food      Nausea    Patient was advised to take Prilosec 20 mg p.o. twice a day.  Continue Mylanta as needed.         No orders of the defined types were placed in this encounter.   Follow-up: No follow-ups on file.    Cletis Athens, MD

## 2020-03-08 NOTE — Assessment & Plan Note (Signed)
Patient was advised to take Prilosec 20 mg p.o. twice a day.  Continue Mylanta as needed.

## 2020-03-08 NOTE — Assessment & Plan Note (Signed)
-   The patient's GERD is not stable on medication.  - Instructed the patient to avoid eating spicy and acidic foods, as well as foods high in fat. - Instructed the patient to avoid eating large meals or meals 2-3 hours prior to sleeping.  

## 2020-03-10 ENCOUNTER — Ambulatory Visit: Payer: 59 | Admitting: Family Medicine

## 2020-03-10 DIAGNOSIS — F419 Anxiety disorder, unspecified: Secondary | ICD-10-CM

## 2020-03-10 DIAGNOSIS — R140 Abdominal distension (gaseous): Secondary | ICD-10-CM

## 2020-03-10 DIAGNOSIS — K2100 Gastro-esophageal reflux disease with esophagitis, without bleeding: Secondary | ICD-10-CM

## 2020-03-11 LAB — CBC WITH DIFFERENTIAL/PLATELET
Absolute Monocytes: 730 cells/uL (ref 200–950)
Basophils Absolute: 22 cells/uL (ref 0–200)
Basophils Relative: 0.3 %
Eosinophils Absolute: 58 cells/uL (ref 15–500)
Eosinophils Relative: 0.8 %
HCT: 48.2 % — ABNORMAL HIGH (ref 35.0–45.0)
Hemoglobin: 16.8 g/dL — ABNORMAL HIGH (ref 11.7–15.5)
Lymphs Abs: 2154 cells/uL (ref 850–3900)
MCH: 33.9 pg — ABNORMAL HIGH (ref 27.0–33.0)
MCHC: 34.9 g/dL (ref 32.0–36.0)
MCV: 97.2 fL (ref 80.0–100.0)
MPV: 9.9 fL (ref 7.5–12.5)
Monocytes Relative: 10 %
Neutro Abs: 4336 cells/uL (ref 1500–7800)
Neutrophils Relative %: 59.4 %
Platelets: 378 10*3/uL (ref 140–400)
RBC: 4.96 10*6/uL (ref 3.80–5.10)
RDW: 12.2 % (ref 11.0–15.0)
Total Lymphocyte: 29.5 %
WBC: 7.3 10*3/uL (ref 3.8–10.8)

## 2020-03-11 LAB — COMPLETE METABOLIC PANEL WITH GFR
AG Ratio: 1.8 (calc) (ref 1.0–2.5)
ALT: 11 U/L (ref 6–29)
AST: 15 U/L (ref 10–35)
Albumin: 4.4 g/dL (ref 3.6–5.1)
Alkaline phosphatase (APISO): 67 U/L (ref 37–153)
BUN: 9 mg/dL (ref 7–25)
CO2: 27 mmol/L (ref 20–32)
Calcium: 10.1 mg/dL (ref 8.6–10.4)
Chloride: 103 mmol/L (ref 98–110)
Creat: 0.68 mg/dL (ref 0.50–0.99)
GFR, Est African American: 109 mL/min/{1.73_m2} (ref >=60)
GFR, Est Non African American: 94 mL/min/{1.73_m2} (ref >=60)
Globulin: 2.5 g/dL (calc) (ref 1.9–3.7)
Glucose, Bld: 91 mg/dL (ref 65–99)
Potassium: 4.9 mmol/L (ref 3.5–5.3)
Sodium: 138 mmol/L (ref 135–146)
Total Bilirubin: 0.6 mg/dL (ref 0.2–1.2)
Total Protein: 6.9 g/dL (ref 6.1–8.1)

## 2020-03-11 LAB — LIPID PANEL
Cholesterol: 220 mg/dL — ABNORMAL HIGH (ref <200)
HDL: 67 mg/dL (ref >=50)
LDL Cholesterol (Calc): 132 mg/dL (calc) — ABNORMAL HIGH
Non-HDL Cholesterol (Calc): 153 mg/dL (calc) — ABNORMAL HIGH (ref <130)
Total CHOL/HDL Ratio: 3.3 (calc) (ref <5.0)
Triglycerides: 105 mg/dL (ref <150)

## 2020-03-11 LAB — TSH: TSH: 1.54 mIU/L (ref 0.40–4.50)

## 2020-03-29 ENCOUNTER — Encounter: Payer: Self-pay | Admitting: Internal Medicine

## 2020-03-29 ENCOUNTER — Ambulatory Visit: Payer: 59 | Admitting: Internal Medicine

## 2020-03-29 VITALS — BP 165/89 | HR 83 | Ht 67.0 in | Wt 156.5 lb

## 2020-03-29 DIAGNOSIS — Z720 Tobacco use: Secondary | ICD-10-CM

## 2020-03-29 DIAGNOSIS — K2100 Gastro-esophageal reflux disease with esophagitis, without bleeding: Secondary | ICD-10-CM

## 2020-03-29 DIAGNOSIS — F419 Anxiety disorder, unspecified: Secondary | ICD-10-CM | POA: Diagnosis not present

## 2020-03-29 DIAGNOSIS — R140 Abdominal distension (gaseous): Secondary | ICD-10-CM | POA: Diagnosis not present

## 2020-03-29 MED ORDER — GABAPENTIN 100 MG PO CAPS: 100.0000 mg | ORAL_CAPSULE | Freq: Three times a day (TID) | ORAL | 6 refills | Status: DC | PRN

## 2020-03-29 NOTE — Assessment & Plan Note (Signed)
-   I instructed the patient to stop smoking and provided them with smoking cessation materials.  - I informed the patient that smoking puts them at increased risk for cancer, COPD, hypertension, and more.  - Informed the patient to seek help if they begin to have trouble breathing, develop chest pain, start to cough up blood, feel faint, or pass out.  

## 2020-03-29 NOTE — Assessment & Plan Note (Signed)
Avoid garlic and onion

## 2020-03-29 NOTE — Assessment & Plan Note (Signed)
-   The patient's GERD is stable on medication.  - Instructed the patient to avoid eating spicy and acidic foods, as well as foods high in fat. - Instructed the patient to avoid eating large meals or meals 2-3 hours prior to sleeping. 

## 2020-03-29 NOTE — Progress Notes (Signed)
Established Patient Office Visit  Subjective:  Patient ID: Caitlin Oneal, female    DOB: 05/03/1955  Age: 65 y.o. MRN: [id number]  CC:  Chief Complaint  Patient presents with  . lab results    HPI  Caitlin Oneal presents for check up  On labs, c/o abdominal swelling , c/o back pain , no loss of wt, pt smoke 1 ppd  Past Medical History:  Diagnosis Date  . GERD (gastroesophageal reflux disease)   . PONV (postoperative nausea and vomiting)    after EGD  . Wears contact lenses   . Wears dentures    Full upper, partial lower    Past Surgical History:  Procedure Laterality Date  . COLONOSCOPY WITH PROPOFOL N/A 08/31/2019   Procedure: COLONOSCOPY WITH BIOPSY;  Surgeon: Midge Minium, MD;  Location: [facility];  Service: Endoscopy;  Laterality: N/A;  . POLYPECTOMY N/A 08/31/2019   Procedure: POLYPECTOMY;  Surgeon: Midge Minium, MD;  Location: [facility];  Service: Endoscopy;  Laterality: N/A;    History reviewed. No pertinent family history.  Social History   Socioeconomic History  . Marital status: Married    Spouse name: Not on file  . Number of children: Not on file  . Years of education: Not on file  . Highest education level: Not on file  Occupational History  . Not on file  Tobacco Use  . Smoking status: Current Every Day Smoker    Packs/day: 1.00    Years: 45.00    Pack years: 45.00    Types: Cigarettes  . Smokeless tobacco: Never Used  . Tobacco comment: started age 65  Vaping Use  . Vaping Use: Never used  Substance and Sexual Activity  . Alcohol use: Yes    Alcohol/week: 1.0 standard drink    Types: 1 Cans of beer per week  . Drug use: Not on file  . Sexual activity: Not on file  Other Topics Concern  . Not on file  Social History Narrative  . Not on file   Social Determinants of Health   Financial Resource Strain: Not on file  Food Insecurity: Not on file  Transportation Needs: Not on file  Physical Activity: Not on file   Stress: Not on file  Social Connections: Not on file  Intimate Partner Violence: Not on file     Current Outpatient Medications:  .  clonazePAM (KLONOPIN) 0.5 MG tablet, Take 1 tablet (0.5 mg total) by mouth 2 (two) times daily as needed for anxiety., Disp: 60 tablet, Rfl: 0 .  Methylcobalamin (B12-ACTIVE PO), Take 1 tablet by mouth daily., Disp: , Rfl:  .  omeprazole (PRILOSEC) 20 MG capsule, TAKE 1 CAPSULE BY MOUTH TWICE A DAY, Disp: 180 capsule, Rfl: 1 .  gabapentin (NEURONTIN) 100 MG capsule, Take 1 capsule (100 mg total) by mouth 3 (three) times daily as needed., Disp: 90 capsule, Rfl: 6   Allergies  Allergen Reactions  . Codeine Nausea Only    ROS Review of Systems  Constitutional: Negative.   HENT: Negative.   Eyes: Negative.   Respiratory: Negative.   Cardiovascular: Negative.   Gastrointestinal: Positive for abdominal distention. Negative for abdominal pain and blood in stool.       Hernia  Endocrine: Negative.   Genitourinary: Negative.   Musculoskeletal: Negative.   Skin: Negative.   Allergic/Immunologic: Negative.   Neurological: Negative.   Hematological: Negative.   Psychiatric/Behavioral: Negative.   All other systems reviewed and are negative.  Objective:    Physical Exam Vitals reviewed.  Constitutional:      Appearance: Normal appearance.  HENT:     Mouth/Throat:     Mouth: Mucous membranes are moist.  Eyes:     Pupils: Pupils are equal, round, and reactive to light.  Neck:     Vascular: No carotid bruit.  Cardiovascular:     Rate and Rhythm: Normal rate and regular rhythm.     Pulses: Normal pulses.     Heart sounds: Normal heart sounds.  Pulmonary:     Effort: Pulmonary effort is normal.     Breath sounds: Normal breath sounds.  Abdominal:     General: Bowel sounds are normal.     Palpations: Abdomen is soft. There is no hepatomegaly, splenomegaly or mass.     Tenderness: There is no abdominal tenderness.     Hernia: No hernia is  present.  Musculoskeletal:        General: No tenderness.     Cervical back: Neck supple.     Right lower leg: No edema.     Left lower leg: No edema.  Skin:    Findings: No rash.  Neurological:     Mental Status: She is alert and oriented to person, place, and time.     Motor: No weakness.  Psychiatric:        Mood and Affect: Mood and affect normal.        Behavior: Behavior normal.     BP (!) 165/89   Pulse 83   Ht 5\' 7"  (1.702 m)   Wt 156 lb 8 oz (71 kg)   BMI 24.51 kg/m  Wt Readings from Last 3 Encounters:  03/29/20 156 lb 8 oz (71 kg)  03/08/20 155 lb 14.4 oz (70.7 kg)  08/31/19 153 lb 3.2 oz (69.5 kg)     Health Maintenance Due  Topic Date Due  . Hepatitis C Screening  Never done  . HIV Screening  Never done  . TETANUS/TDAP  Never done  . PAP SMEAR-Modifier  Never done  . MAMMOGRAM  Never done  . COVID-19 Vaccine (2 - Booster for 09/02/19 series) 06/20/2019  . INFLUENZA VACCINE  Never done    There are no preventive care reminders to display for this patient.  Lab Results  Component Value Date   TSH 1.54 03/10/2020   Lab Results  Component Value Date   WBC 7.3 03/10/2020   HGB 16.8 (H) 03/10/2020   HCT 48.2 (H) 03/10/2020   MCV 97.2 03/10/2020   PLT 378 03/10/2020   Lab Results  Component Value Date   NA 138 03/10/2020   K 4.9 03/10/2020   CO2 27 03/10/2020   GLUCOSE 91 03/10/2020   BUN 9 03/10/2020   CREATININE 0.68 03/10/2020   BILITOT 0.6 03/10/2020   AST 15 03/10/2020   ALT 11 03/10/2020   PROT 6.9 03/10/2020   CALCIUM 10.1 03/10/2020   Lab Results  Component Value Date   CHOL 220 (H) 03/10/2020   Lab Results  Component Value Date   HDL 67 03/10/2020   Lab Results  Component Value Date   LDLCALC 132 (H) 03/10/2020   Lab Results  Component Value Date   TRIG 105 03/10/2020   Lab Results  Component Value Date   CHOLHDL 3.3 03/10/2020   No results found for: HGBA1C    Assessment & Plan:   Problem List Items Addressed  This Visit      Digestive   Gastroesophageal  reflux disease with esophagitis without hemorrhage - Primary    - The patient's GERD is stable on medication.  - Instructed the patient to avoid eating spicy and acidic foods, as well as foods high in fat. - Instructed the patient to avoid eating large meals or meals 2-3 hours prior to sleeping.        Other   Anxiety    - Patient experiencing high levels of anxiety.  - Encouraged patient to engage in relaxing activities like yoga, meditation, journaling, going for a walk, or participating in a hobby.  - Encouraged patient to reach out to trusted friends or family members about recent struggles      Bloating symptom    Avoid garlic and onion      Tobacco abuse    - I instructed the patient to stop smoking and provided them with smoking cessation materials.  - I informed the patient that smoking puts them at increased risk for cancer, COPD, hypertension, and more.  - Informed the patient to seek help if they begin to have trouble breathing, develop chest pain, start to cough up blood, feel faint, or pass out.         Meds ordered this encounter  Medications  . gabapentin (NEURONTIN) 100 MG capsule    Sig: Take 1 capsule (100 mg total) by mouth 3 (three) times daily as needed.    Dispense:  90 capsule    Refill:  6  labs reviewed with the patient.  She was found to have hyperlipidemia, so she was started on low-cholesterol diet Hypercholesterolemia  I advised the patient to follow Mediterranean diet This diet is rich in fruits vegetables and whole grain, and This diet is also rich in fish and lean meat Patient should also eat a handful of almonds or walnuts daily Recent heart study indicated that average follow-up on this kind of diet reduces the cardiovascular mortality by 50 to 70%==  Follow-up: No follow-ups on file.    Corky Downs, MD

## 2020-03-29 NOTE — Assessment & Plan Note (Signed)
-   Patient experiencing high levels of anxiety.  - Encouraged patient to engage in relaxing activities like yoga, meditation, journaling, going for a walk, or participating in a hobby.  - Encouraged patient to reach out to trusted friends or family members about recent struggles 

## 2020-06-29 ENCOUNTER — Other Ambulatory Visit: Payer: Self-pay | Admitting: Internal Medicine

## 2020-07-21 ENCOUNTER — Other Ambulatory Visit: Payer: Self-pay | Admitting: Nurse Practitioner

## 2020-07-21 DIAGNOSIS — Z1231 Encounter for screening mammogram for malignant neoplasm of breast: Secondary | ICD-10-CM

## 2020-09-28 ENCOUNTER — Inpatient Hospital Stay: Admission: RE | Admit: 2020-09-28 | Payer: 59 | Source: Ambulatory Visit

## 2020-10-11 ENCOUNTER — Ambulatory Visit
Admission: RE | Admit: 2020-10-11 | Discharge: 2020-10-11 | Disposition: A | Discharge status: Home / Self Care | Payer: 59 | Source: Ambulatory Visit | Attending: Nurse Practitioner | Admitting: Nurse Practitioner

## 2020-10-11 ENCOUNTER — Other Ambulatory Visit: Payer: Self-pay

## 2020-10-11 DIAGNOSIS — Z1231 Encounter for screening mammogram for malignant neoplasm of breast: Secondary | ICD-10-CM | POA: Diagnosis present

## 2020-10-19 ENCOUNTER — Other Ambulatory Visit: Payer: Self-pay | Admitting: Nurse Practitioner

## 2020-10-20 ENCOUNTER — Other Ambulatory Visit: Payer: Self-pay | Admitting: Nurse Practitioner

## 2020-10-20 DIAGNOSIS — R928 Other abnormal and inconclusive findings on diagnostic imaging of breast: Secondary | ICD-10-CM

## 2020-10-28 ENCOUNTER — Ambulatory Visit
Admission: RE | Admit: 2020-10-28 | Discharge: 2020-10-28 | Disposition: A | Discharge status: Home / Self Care | Payer: 59 | Source: Ambulatory Visit | Attending: Nurse Practitioner | Admitting: Nurse Practitioner

## 2020-10-28 ENCOUNTER — Other Ambulatory Visit: Payer: Self-pay

## 2020-10-28 ENCOUNTER — Other Ambulatory Visit: Payer: Self-pay | Admitting: Internal Medicine

## 2020-10-28 DIAGNOSIS — R928 Other abnormal and inconclusive findings on diagnostic imaging of breast: Secondary | ICD-10-CM

## 2020-11-03 ENCOUNTER — Other Ambulatory Visit: Payer: Self-pay | Admitting: Surgery

## 2020-11-03 DIAGNOSIS — R1900 Intra-abdominal and pelvic swelling, mass and lump, unspecified site: Secondary | ICD-10-CM

## 2020-11-22 ENCOUNTER — Ambulatory Visit
Admission: RE | Admit: 2020-11-22 | Discharge: 2020-11-22 | Disposition: A | Discharge status: Home / Self Care | Payer: 59 | Source: Ambulatory Visit | Attending: Surgery | Admitting: Surgery

## 2020-11-22 ENCOUNTER — Other Ambulatory Visit: Payer: Self-pay

## 2020-11-22 DIAGNOSIS — R1900 Intra-abdominal and pelvic swelling, mass and lump, unspecified site: Secondary | ICD-10-CM | POA: Diagnosis present

## 2020-11-23 ENCOUNTER — Other Ambulatory Visit: Payer: Self-pay | Admitting: Surgery

## 2020-11-23 ENCOUNTER — Other Ambulatory Visit (HOSPITAL_COMMUNITY): Payer: Self-pay | Admitting: Surgery

## 2020-11-23 DIAGNOSIS — R1900 Intra-abdominal and pelvic swelling, mass and lump, unspecified site: Secondary | ICD-10-CM

## 2020-11-24 ENCOUNTER — Other Ambulatory Visit: Payer: Self-pay

## 2020-11-24 ENCOUNTER — Ambulatory Visit
Admission: RE | Admit: 2020-11-24 | Discharge: 2020-11-24 | Disposition: A | Discharge status: Home / Self Care | Payer: 59 | Source: Ambulatory Visit | Attending: Surgery | Admitting: Surgery

## 2020-11-24 DIAGNOSIS — R1900 Intra-abdominal and pelvic swelling, mass and lump, unspecified site: Secondary | ICD-10-CM | POA: Insufficient documentation

## 2020-11-24 MED ORDER — GADOBUTROL 1 MMOL/ML IV SOLN
7.0000 mL | Freq: Once | INTRAVENOUS | Status: AC | PRN
  Administered 2020-11-24: 7 mL via INTRAVENOUS

## 2020-11-28 ENCOUNTER — Ambulatory Visit: Payer: Self-pay | Admitting: Surgery

## 2020-11-28 NOTE — H&P (View-Only) (Signed)
Subjective:   CC: Abdominal wall bulge [R19.00]   HPI:  Caitlin Oneal is a 65 y.o. female who was referred by Myrene Buddy, NP for evaluation of above. Symptoms were first noted many years ago.increasing discomfort and size. No specific associated symptoms or aggreviating, alleviating factors.   Past Medical History:  has a past medical history of Colon polyps, GERD (gastroesophageal reflux disease), History of migraine, Hypertension, Mild hyperlipidemia (2022), Plantar fasciitis, Primary osteoarthritis of feet, bilateral, Restless legs syndrome (RLS), Tubular adenoma (08/2019), and Ventral hernia.   Past Surgical History:  Past Surgical History: Procedure Laterality Date  COLONOSCOPY   08/31/2019   Dr. Servando Snare.  Q63yr repeat 2/2 colon polyps.  EGD   1999   GERD  Hand Surgery Right     Ganglion cyst removal x3.  + what sounds like [facility] arthritis surgery.  HYSTERECTOMY   2015   Done 2/2 pelvic pain r/t scarring.  As far as she knows it was a Total hyst.  TUBAL LIGATION         Family History: family history includes Aortic aneurysm in her mother; Heart failure in her sister; High blood pressure (Hypertension) in her father and mother; Kidney failure in her father; Myocardial Infarction (Heart attack) in her brother and mother; No Known Problems in her son and son; Other in her brother; Stroke in her father.   Social History:  reports that she has been smoking cigarettes. She has been smoking about 1.00 pack per day. She has never used smokeless tobacco. She reports current alcohol use of about 3.0 - 4.0 standard drinks of alcohol per week. She reports that she does not use drugs.   Current Medications: has a current medication list which includes the following prescription(s): aspirin, citalopram, gabapentin, losartan, methocarbamol, omeprazole, and vitamin b complex.   Allergies:  Allergies as of 10/31/2020 - Reviewed 10/31/2020 Allergen Reaction Noted  Percocet  [oxycodone-acetaminophen] Itching 05/16/2020  Codeine Nausea 06/19/2019     ROS:  A 15 point review of systems was performed and pertinent positives and negatives noted in HPI   Objective:   BP (!) 156/79   Pulse 81   Ht 167.6 cm (5\' 6" )   Wt 69.4 kg (153 lb)   BMI 24.69 kg/m    Constitutional :  Alert, cooperative, no distress Lymphatics/Throat:  Supple, no lymphadenopathy Respiratory:  clear to auscultation bilaterally Cardiovascular:  regular rate and rhythm Gastrointestinal: soft, non-tender; bowel sounds normal; no masses,  no organomegaly. Obese, some TTP in epigastric region but no obvious palpable hernia defect at edges.  Bulge is noted in midline while standing, but no definitive borders Musculoskeletal: Steady gait and movement Skin: Cool and moist, no surgical scars Psychiatric: Normal affect, non-agitated, not confused       LABS:  n/a    RADS: n/a Assessment:       Abdominal wall bulge [R19.00]  distasis vs ventral hernia   Plan:   1. Abdominal wall bulge [R19.00]   Will proceed with CT scan to evaluate further.  Call pt when results available.  UPDATE: Called pt back with CT/MRI results and discussed proceeding with ventral hernia repair.   Discussed the risk of surgery including recurrence, which can be up to 50% in the case of incisional or complex hernias, possible use of prosthetic materials (mesh) and the increased risk of mesh infxn if used, bleeding, chronic pain, post-op infxn, post-op SBO or ileus, and possible re-operation to address said risks. The risks of general anesthetic,  if used, includes MI, CVA, sudden death or even reaction to anesthetic medications also discussed. Alternatives include continued observation.  Benefits include possible symptom relief, prevention of incarceration, strangulation, enlargement in size over time, and the risk of emergency surgery in the face of strangulation.    Typical post-op recovery time of 3-5 days with 2  weeks of activity restrictions were also discussed.   ED return precautions given for sudden increase in pain, size of hernia with accompanying fever, nausea, and/or vomiting.   The patient verbalized understanding and all questions were answered to the patient's satisfaction.     2. Patient has elected to proceed with surgical treatment. Procedure will be scheduled. robotic assisted laparoscopic, possible open, possible bowel resection

## 2020-11-28 NOTE — H&P (Signed)
Subjective:   CC: Abdominal wall bulge [R19.00]   HPI:  Caitlin Oneal Reasons is a 65 y.o. female who was referred by Myrene Buddy, NP for evaluation of above. Symptoms were first noted many years ago.increasing discomfort and size. No specific associated symptoms or aggreviating, alleviating factors.   Past Medical History:  has a past medical history of Colon polyps, GERD (gastroesophageal reflux disease), History of migraine, Hypertension, Mild hyperlipidemia (2022), Plantar fasciitis, Primary osteoarthritis of feet, bilateral, Restless legs syndrome (RLS), Tubular adenoma (08/2019), and Ventral hernia.   Past Surgical History:  Past Surgical History: Procedure Laterality Date  COLONOSCOPY   08/31/2019   Dr. Servando Snare.  Q10yr repeat 2/2 colon polyps.  EGD   1999   GERD  Hand Surgery Right     Ganglion cyst removal x3.  + what sounds like [facility] arthritis surgery.  HYSTERECTOMY   2015   Done 2/2 pelvic pain r/t scarring.  As far as she knows it was a Total hyst.  TUBAL LIGATION         Family History: family history includes Aortic aneurysm in her mother; Heart failure in her sister; High blood pressure (Hypertension) in her father and mother; Kidney failure in her father; Myocardial Infarction (Heart attack) in her brother and mother; No Known Problems in her son and son; Other in her brother; Stroke in her father.   Social History:  reports that she has been smoking cigarettes. She has been smoking about 1.00 pack per day. She has never used smokeless tobacco. She reports current alcohol use of about 3.0 - 4.0 standard drinks of alcohol per week. She reports that she does not use drugs.   Current Medications: has a current medication list which includes the following prescription(s): aspirin, citalopram, gabapentin, losartan, methocarbamol, omeprazole, and vitamin b complex.   Allergies:  Allergies as of 10/31/2020 - Reviewed 10/31/2020 Allergen Reaction Noted  Percocet  [oxycodone-acetaminophen] Itching 05/16/2020  Codeine Nausea 06/19/2019     ROS:  A 15 point review of systems was performed and pertinent positives and negatives noted in HPI   Objective:   BP (!) 156/79   Pulse 81   Ht 167.6 cm (5\' 6" )   Wt 69.4 kg (153 lb)   BMI 24.69 kg/m    Constitutional :  Alert, cooperative, no distress Lymphatics/Throat:  Supple, no lymphadenopathy Respiratory:  clear to auscultation bilaterally Cardiovascular:  regular rate and rhythm Gastrointestinal: soft, non-tender; bowel sounds normal; no masses,  no organomegaly. Obese, some TTP in epigastric region but no obvious palpable hernia defect at edges.  Bulge is noted in midline while standing, but no definitive borders Musculoskeletal: Steady gait and movement Skin: Cool and moist, no surgical scars Psychiatric: Normal affect, non-agitated, not confused       LABS:  n/a    RADS: n/a Assessment:       Abdominal wall bulge [R19.00]  distasis vs ventral hernia   Plan:   1. Abdominal wall bulge [R19.00]   Will proceed with CT scan to evaluate further.  Call pt when results available.  UPDATE: Called pt back with CT/MRI results and discussed proceeding with ventral hernia repair.   Discussed the risk of surgery including recurrence, which can be up to 50% in the case of incisional or complex hernias, possible use of prosthetic materials (mesh) and the increased risk of mesh infxn if used, bleeding, chronic pain, post-op infxn, post-op SBO or ileus, and possible re-operation to address said risks. The risks of general anesthetic,  if used, includes MI, CVA, sudden death or even reaction to anesthetic medications also discussed. Alternatives include continued observation.  Benefits include possible symptom relief, prevention of incarceration, strangulation, enlargement in size over time, and the risk of emergency surgery in the face of strangulation.    Typical post-op recovery time of 3-5 days with 2  weeks of activity restrictions were also discussed.   ED return precautions given for sudden increase in pain, size of hernia with accompanying fever, nausea, and/or vomiting.   The patient verbalized understanding and all questions were answered to the patient's satisfaction.     2. Patient has elected to proceed with surgical treatment. Procedure will be scheduled. robotic assisted laparoscopic, possible open, possible bowel resection

## 2020-12-14 ENCOUNTER — Other Ambulatory Visit: Payer: Self-pay

## 2020-12-14 ENCOUNTER — Other Ambulatory Visit
Admission: RE | Admit: 2020-12-14 | Discharge: 2020-12-14 | Disposition: A | Discharge status: Home / Self Care | Payer: 59 | Source: Ambulatory Visit | Attending: Surgery | Admitting: Surgery

## 2020-12-14 DIAGNOSIS — Z01812 Encounter for preprocedural laboratory examination: Secondary | ICD-10-CM

## 2020-12-14 HISTORY — DX: Pneumonia, unspecified organism: J18.9

## 2020-12-14 HISTORY — DX: Unspecified osteoarthritis, unspecified site: M19.90

## 2020-12-14 HISTORY — DX: Essential (primary) hypertension: I10

## 2020-12-14 HISTORY — DX: Anxiety disorder, unspecified: F41.9

## 2020-12-14 NOTE — Patient Instructions (Addendum)
Your procedure is scheduled on: 12/22/20 - Thursday Report to the Registration Desk on the 1st floor of the [facility]. To find out your arrival time, please call [phone] between 1PM - 3PM on: 12/21/20 - Wednesday Report to [facility] Labs on 12/16/20 at 1:00 pm.  REMEMBER: Instructions that are not followed completely may result in serious medical risk, up to and including death; or upon the discretion of your surgeon and anesthesiologist your surgery may need to be rescheduled.  Do not eat food after midnight the night before surgery.  No gum chewing, lozengers or hard candies.  You may however, drink CLEAR liquids up to 2 hours before you are scheduled to arrive for your surgery. Do not drink anything within 2 hours of your scheduled arrival time.  Clear liquids include: - water  - apple juice without pulp - gatorade (not RED, PURPLE, OR BLUE) - black coffee or tea (Do NOT add milk or creamers to the coffee or tea) Do NOT drink anything that is not on this list.  TAKE THESE MEDICATIONS THE MORNING OF SURGERY WITH A SIP OF WATER:  - citalopram (CELEXA) 20 MG tablet  - omeprazole (PRILOSEC) 20 MG capsule, (take one the night before and one on the morning of surgery - helps to prevent nausea after surgery.)  Follow recommendations from Cardiologist, Pulmonologist or PCP regarding stopping Aspirin, Coumadin, Plavix, Eliquis, Pradaxa, or Pletal. Stop Aspirin 81 mg beginning 12/17/20, may resume with MD order.  One week prior to surgery: Stop Anti-inflammatories (NSAIDS) such as Advil, Aleve, Ibuprofen, Motrin, Naproxen, Naprosyn and Aspirin based products such as Excedrin, Goodys Powder, BC Powder.  Stop ANY OVER THE COUNTER supplements until after surgery.  You may however, continue to take Tylenol if needed for pain up until the day of surgery.  No Alcohol for 24 hours before or after surgery.  No Smoking including e-cigarettes for 24 hours prior to surgery.   No chewable tobacco products for at least 6 hours prior to surgery.  No nicotine patches on the day of surgery.  Do not use any "recreational" drugs for at least a week prior to your surgery.  Please be advised that the combination of cocaine and anesthesia may have negative outcomes, up to and including death. If you test positive for cocaine, your surgery will be cancelled.  On the morning of surgery brush your teeth with toothpaste and water, you may rinse your mouth with mouthwash if you wish. Do not swallow any toothpaste or mouthwash.  Use CHG Soap or wipes as directed on instruction sheet.  Do not wear jewelry, make-up, hairpins, clips or nail polish.  Do not wear lotions, powders, or perfumes.   Do not shave body from the neck down 48 hours prior to surgery just in case you cut yourself which could leave a site for infection.  Also, freshly shaved skin may become irritated if using the CHG soap.  Contact lenses, hearing aids and dentures may not be worn into surgery.  Do not bring valuables to the hospital. [facility] is not responsible for any missing/lost belongings or valuables.   Notify your doctor if there is any change in your medical condition (cold, fever, infection).  Wear comfortable clothing (specific to your surgery type) to the hospital.  After surgery, you can help prevent lung complications by doing breathing exercises.  Take deep breaths and cough every 1-2 hours. Your doctor may order a device called an Incentive Spirometer to help you  take deep breaths. When coughing or sneezing, hold a pillow firmly against your incision with both hands. This is called "splinting." Doing this helps protect your incision. It also decreases belly discomfort.  If you are being admitted to the hospital overnight, leave your suitcase in the car. After surgery it may be brought to your room.  If you are being discharged the day of surgery, you will not be allowed to drive  home. You will need a responsible adult (18 years or older) to drive you home and stay with you that night.   If you are taking public transportation, you will need to have a responsible adult (18 years or older) with you. Please confirm with your physician that it is acceptable to use public transportation.   Please call the Pre-admissions Testing Dept. at [phone] if you have any questions about these instructions.  Surgery Visitation Policy:  Patients undergoing a surgery or procedure may have one family member or support person with them as long as that person is not COVID-19 positive or experiencing its symptoms.  That person may remain in the waiting area during the procedure and may rotate out with other people.  Inpatient Visitation:    Visiting hours are 7 a.m. to 8 p.m. Up to two visitors ages 16+ are allowed at one time in a patient room. The visitors may rotate out with other people during the day. Visitors must check out when they leave, or other visitors will not be allowed. One designated support person may remain overnight. The visitor must pass COVID-19 screenings, use hand sanitizer when entering and exiting the patient's room and wear a mask at all times, including in the patient's room. Patients must also wear a mask when staff or their visitor are in the room. Masking is required regardless of vaccination status.

## 2020-12-16 ENCOUNTER — Other Ambulatory Visit: Payer: Self-pay

## 2020-12-16 ENCOUNTER — Other Ambulatory Visit
Admission: RE | Admit: 2020-12-16 | Discharge: 2020-12-16 | Disposition: A | Discharge status: Home / Self Care | Payer: 59 | Source: Ambulatory Visit | Attending: Surgery | Admitting: Surgery

## 2020-12-16 DIAGNOSIS — Z01812 Encounter for preprocedural laboratory examination: Secondary | ICD-10-CM | POA: Diagnosis not present

## 2020-12-16 LAB — COMPREHENSIVE METABOLIC PANEL
ALT: 13 U/L (ref 0–44)
AST: 19 U/L (ref 15–41)
Albumin: 4.2 g/dL (ref 3.5–5.0)
Alkaline Phosphatase: 62 U/L (ref 38–126)
Anion gap: 9 (ref 5–15)
BUN: 9 mg/dL (ref 8–23)
CO2: 26 mmol/L (ref 22–32)
Calcium: 9.2 mg/dL (ref 8.9–10.3)
Chloride: 101 mmol/L (ref 98–111)
Creatinine, Ser: 0.52 mg/dL (ref 0.44–1.00)
GFR, Estimated: >60 mL/min (ref >60)
Glucose, Bld: 131 mg/dL — ABNORMAL HIGH (ref 70–99)
Potassium: 3.7 mmol/L (ref 3.5–5.1)
Sodium: 136 mmol/L (ref 135–145)
Total Bilirubin: 0.7 mg/dL (ref 0.3–1.2)
Total Protein: 6.9 g/dL (ref 6.5–8.1)

## 2020-12-16 LAB — TYPE AND SCREEN
ABO/RH(D): O POS
Antibody Screen: NEGATIVE

## 2020-12-16 LAB — CBC
HCT: 49.7 % — ABNORMAL HIGH (ref 36.0–46.0)
Hemoglobin: 16.8 g/dL — ABNORMAL HIGH (ref 12.0–15.0)
MCH: 33.9 pg (ref 26.0–34.0)
MCHC: 33.8 g/dL (ref 30.0–36.0)
MCV: 100.2 fL — ABNORMAL HIGH (ref 80.0–100.0)
Platelets: 342 10*3/uL (ref 150–400)
RBC: 4.96 MIL/uL (ref 3.87–5.11)
RDW: 12.6 % (ref 11.5–15.5)
WBC: 8 10*3/uL (ref 4.0–10.5)
nRBC: 0 % (ref 0.0–0.2)

## 2020-12-22 ENCOUNTER — Other Ambulatory Visit: Payer: Self-pay

## 2020-12-22 ENCOUNTER — Encounter: Payer: Self-pay | Admitting: Surgery

## 2020-12-22 ENCOUNTER — Encounter
Admission: RE | Disposition: A | Discharge status: Home / Self Care | Payer: Self-pay | Source: Ambulatory Visit | Attending: Surgery

## 2020-12-22 ENCOUNTER — Ambulatory Visit
Admission: RE | Admit: 2020-12-22 | Discharge: 2020-12-22 | Disposition: A | Discharge status: Home / Self Care | Payer: 59 | Source: Ambulatory Visit | Attending: Surgery | Admitting: Surgery

## 2020-12-22 ENCOUNTER — Ambulatory Visit: Payer: 59 | Admitting: Anesthesiology

## 2020-12-22 DIAGNOSIS — R0602 Shortness of breath: Secondary | ICD-10-CM | POA: Insufficient documentation

## 2020-12-22 DIAGNOSIS — F419 Anxiety disorder, unspecified: Secondary | ICD-10-CM | POA: Insufficient documentation

## 2020-12-22 DIAGNOSIS — Z79899 Other long term (current) drug therapy: Secondary | ICD-10-CM | POA: Diagnosis not present

## 2020-12-22 DIAGNOSIS — K439 Ventral hernia without obstruction or gangrene: Secondary | ICD-10-CM | POA: Diagnosis present

## 2020-12-22 DIAGNOSIS — K219 Gastro-esophageal reflux disease without esophagitis: Secondary | ICD-10-CM | POA: Diagnosis not present

## 2020-12-22 DIAGNOSIS — M1990 Unspecified osteoarthritis, unspecified site: Secondary | ICD-10-CM | POA: Insufficient documentation

## 2020-12-22 DIAGNOSIS — I10 Essential (primary) hypertension: Secondary | ICD-10-CM | POA: Insufficient documentation

## 2020-12-22 DIAGNOSIS — F17210 Nicotine dependence, cigarettes, uncomplicated: Secondary | ICD-10-CM | POA: Diagnosis not present

## 2020-12-22 HISTORY — PX: XI ROBOTIC ASSISTED VENTRAL HERNIA: SHX6789

## 2020-12-22 LAB — ABO/RH: ABO/RH(D): O POS

## 2020-12-22 SURGERY — REPAIR, HERNIA, VENTRAL, ROBOT-ASSISTED
Anesthesia: General | Site: Abdomen

## 2020-12-22 MED ORDER — DEXAMETHASONE SODIUM PHOSPHATE 10 MG/ML IJ SOLN
INTRAMUSCULAR | Status: DC | PRN
  Administered 2020-12-22: 10 mg via INTRAVENOUS

## 2020-12-22 MED ORDER — DEXMEDETOMIDINE HCL IN NACL 200 MCG/50ML IV SOLN
INTRAVENOUS | Status: DC | PRN
  Administered 2020-12-22: 5 ug via INTRAVENOUS
  Administered 2020-12-22: 10 ug via INTRAVENOUS

## 2020-12-22 MED ORDER — PROPOFOL 10 MG/ML IV BOLUS
INTRAVENOUS | Status: DC | PRN
  Administered 2020-12-22: 160 mg via INTRAVENOUS

## 2020-12-22 MED ORDER — CEFAZOLIN SODIUM-DEXTROSE 2-4 GM/100ML-% IV SOLN
2.0000 g | INTRAVENOUS | Status: AC
  Administered 2020-12-22: 2 g via INTRAVENOUS

## 2020-12-22 MED ORDER — BUPIVACAINE LIPOSOME 1.3 % IJ SUSP
INTRAMUSCULAR | Status: AC
  Filled 2020-12-22: qty 20

## 2020-12-22 MED ORDER — CELECOXIB 200 MG PO CAPS
ORAL_CAPSULE | ORAL | Status: AC
  Filled 2020-12-22: qty 1

## 2020-12-22 MED ORDER — ONDANSETRON HCL 4 MG/2ML IJ SOLN
INTRAMUSCULAR | Status: DC | PRN
  Administered 2020-12-22: 4 mg via INTRAVENOUS

## 2020-12-22 MED ORDER — GABAPENTIN 300 MG PO CAPS
300.0000 mg | ORAL_CAPSULE | ORAL | Status: AC
  Administered 2020-12-22: 300 mg via ORAL

## 2020-12-22 MED ORDER — TRAMADOL HCL 50 MG PO TABS: 50.0000 mg | ORAL_TABLET | Freq: Four times a day (QID) | ORAL | 0 refills | Status: AC | PRN

## 2020-12-22 MED ORDER — ACETAMINOPHEN 500 MG PO TABS
1000.0000 mg | ORAL_TABLET | ORAL | Status: AC
  Administered 2020-12-22: 1000 mg via ORAL

## 2020-12-22 MED ORDER — CHLORHEXIDINE GLUCONATE 0.12 % MT SOLN
OROMUCOSAL | Status: AC
  Filled 2020-12-22: qty 15

## 2020-12-22 MED ORDER — FENTANYL CITRATE (PF) 100 MCG/2ML IJ SOLN
INTRAMUSCULAR | Status: DC | PRN
  Administered 2020-12-22 (×2): 50 ug via INTRAVENOUS

## 2020-12-22 MED ORDER — GABAPENTIN 300 MG PO CAPS
ORAL_CAPSULE | ORAL | Status: AC
  Filled 2020-12-22: qty 1

## 2020-12-22 MED ORDER — DOCUSATE SODIUM 100 MG PO CAPS: 100.0000 mg | ORAL_CAPSULE | Freq: Two times a day (BID) | ORAL | 0 refills | Status: AC | PRN

## 2020-12-22 MED ORDER — APREPITANT 40 MG PO CAPS
ORAL_CAPSULE | ORAL | Status: AC
  Filled 2020-12-22: qty 1

## 2020-12-22 MED ORDER — IBUPROFEN 800 MG PO TABS: 800.0000 mg | ORAL_TABLET | Freq: Three times a day (TID) | ORAL | 0 refills | Status: AC | PRN

## 2020-12-22 MED ORDER — OXYCODONE HCL 5 MG PO TABS
ORAL_TABLET | ORAL | Status: AC
  Filled 2020-12-22: qty 1

## 2020-12-22 MED ORDER — LACTATED RINGERS IV SOLN: INTRAVENOUS | Status: DC

## 2020-12-22 MED ORDER — MIDAZOLAM HCL 2 MG/2ML IJ SOLN
INTRAMUSCULAR | Status: DC | PRN
  Administered 2020-12-22: 2 mg via INTRAVENOUS

## 2020-12-22 MED ORDER — OXYCODONE HCL 5 MG PO TABS
5.0000 mg | ORAL_TABLET | Freq: Once | ORAL | Status: AC | PRN
  Administered 2020-12-22: 5 mg via ORAL

## 2020-12-22 MED ORDER — DEXMEDETOMIDINE HCL IN NACL 200 MCG/50ML IV SOLN
INTRAVENOUS | Status: AC
  Filled 2020-12-22: qty 50

## 2020-12-22 MED ORDER — CEFAZOLIN SODIUM-DEXTROSE 2-4 GM/100ML-% IV SOLN
INTRAVENOUS | Status: AC
  Filled 2020-12-22: qty 100

## 2020-12-22 MED ORDER — BUPIVACAINE-EPINEPHRINE 0.5% -1:200000 IJ SOLN
INTRAMUSCULAR | Status: DC | PRN
  Administered 2020-12-22: 20 mL

## 2020-12-22 MED ORDER — ACETAMINOPHEN 10 MG/ML IV SOLN: 1000.0000 mg | Freq: Once | INTRAVENOUS | Status: DC | PRN

## 2020-12-22 MED ORDER — FENTANYL CITRATE (PF) 100 MCG/2ML IJ SOLN
INTRAMUSCULAR | Status: AC
  Filled 2020-12-22: qty 2

## 2020-12-22 MED ORDER — ACETAMINOPHEN 500 MG PO TABS
ORAL_TABLET | ORAL | Status: AC
  Filled 2020-12-22: qty 2

## 2020-12-22 MED ORDER — SODIUM CHLORIDE (PF) 0.9 % IJ SOLN
INTRAMUSCULAR | Status: AC
  Filled 2020-12-22: qty 50

## 2020-12-22 MED ORDER — CHLORHEXIDINE GLUCONATE CLOTH 2 % EX PADS
6.0000 | MEDICATED_PAD | Freq: Once | CUTANEOUS | Status: AC
  Administered 2020-12-22: 6 via TOPICAL

## 2020-12-22 MED ORDER — FENTANYL CITRATE (PF) 100 MCG/2ML IJ SOLN
25.0000 ug | INTRAMUSCULAR | Status: DC | PRN
  Administered 2020-12-22 (×4): 25 ug via INTRAVENOUS

## 2020-12-22 MED ORDER — MIDAZOLAM HCL 2 MG/2ML IJ SOLN
INTRAMUSCULAR | Status: AC
  Filled 2020-12-22: qty 2

## 2020-12-22 MED ORDER — PROPOFOL 10 MG/ML IV BOLUS
INTRAVENOUS | Status: AC
  Filled 2020-12-22: qty 20

## 2020-12-22 MED ORDER — ORAL CARE MOUTH RINSE: 15.0000 mL | Freq: Once | OROMUCOSAL | Status: AC

## 2020-12-22 MED ORDER — ROCURONIUM BROMIDE 100 MG/10ML IV SOLN
INTRAVENOUS | Status: DC | PRN
Start: 2020-12-22 — End: 2020-12-22
  Administered 2020-12-22: 40 mg via INTRAVENOUS
  Administered 2020-12-22 (×2): 10 mg via INTRAVENOUS
  Administered 2020-12-22: 20 mg via INTRAVENOUS

## 2020-12-22 MED ORDER — BUPIVACAINE-EPINEPHRINE (PF) 0.5% -1:200000 IJ SOLN
INTRAMUSCULAR | Status: AC
  Filled 2020-12-22: qty 30

## 2020-12-22 MED ORDER — APREPITANT 40 MG PO CAPS
40.0000 mg | ORAL_CAPSULE | Freq: Once | ORAL | Status: AC
  Administered 2020-12-22: 40 mg via ORAL

## 2020-12-22 MED ORDER — BUPIVACAINE HCL (PF) 0.5 % IJ SOLN
INTRAMUSCULAR | Status: AC
  Filled 2020-12-22: qty 30

## 2020-12-22 MED ORDER — SUGAMMADEX SODIUM 200 MG/2ML IV SOLN
INTRAVENOUS | Status: DC | PRN
  Administered 2020-12-22: 274 mg via INTRAVENOUS

## 2020-12-22 MED ORDER — LIDOCAINE HCL (CARDIAC) PF 100 MG/5ML IV SOSY
PREFILLED_SYRINGE | INTRAVENOUS | Status: DC | PRN
  Administered 2020-12-22: 80 mg via INTRAVENOUS

## 2020-12-22 MED ORDER — SODIUM CHLORIDE 0.9 % IV SOLN
INTRAVENOUS | Status: DC | PRN
  Administered 2020-12-22: 70 mL

## 2020-12-22 MED ORDER — ACETAMINOPHEN 325 MG PO TABS: 650.0000 mg | ORAL_TABLET | Freq: Three times a day (TID) | ORAL | 0 refills | Status: AC | PRN

## 2020-12-22 MED ORDER — GABAPENTIN 100 MG PO CAPS
300.0000 mg | ORAL_CAPSULE | Freq: Every day | ORAL | Status: AC
Start: 2020-12-22 — End: ?

## 2020-12-22 MED ORDER — CELECOXIB 200 MG PO CAPS
200.0000 mg | ORAL_CAPSULE | ORAL | Status: AC
  Administered 2020-12-22: 200 mg via ORAL

## 2020-12-22 MED ORDER — OXYCODONE HCL 5 MG/5ML PO SOLN: 5.0000 mg | Freq: Once | ORAL | Status: AC | PRN

## 2020-12-22 MED ORDER — PROMETHAZINE HCL 25 MG/ML IJ SOLN: 6.2500 mg | INTRAMUSCULAR | Status: DC | PRN

## 2020-12-22 MED ORDER — CHLORHEXIDINE GLUCONATE 0.12 % MT SOLN
15.0000 mL | Freq: Once | OROMUCOSAL | Status: AC
  Administered 2020-12-22: 15 mL via OROMUCOSAL

## 2020-12-22 SURGICAL SUPPLY — 60 items
ADH SKN CLS APL DERMABOND .7 (GAUZE/BANDAGES/DRESSINGS) ×1
APL PRP STRL LF DISP 70% ISPRP (MISCELLANEOUS) ×1
BLADE SURG SZ11 CARB STEEL (BLADE) ×2 IMPLANT
CANNULA REDUC XI 12-8 STAPL (CANNULA) ×1
CANNULA REDUCER 12-8 DVNC XI (CANNULA) ×1 IMPLANT
CHLORAPREP W/TINT 26 (MISCELLANEOUS) ×2 IMPLANT
COVER TIP SHEARS 8 DVNC (MISCELLANEOUS) ×1 IMPLANT
COVER TIP SHEARS 8MM DA VINCI (MISCELLANEOUS) ×1
DEFOGGER SCOPE WARMER CLEARIFY (MISCELLANEOUS) ×2 IMPLANT
DERMABOND ADVANCED (GAUZE/BANDAGES/DRESSINGS) ×1
DERMABOND ADVANCED .7 DNX12 (GAUZE/BANDAGES/DRESSINGS) ×1 IMPLANT
DRAPE 3/4 80X56 (DRAPES) ×2 IMPLANT
DRAPE ARM DVNC X/XI (DISPOSABLE) ×3 IMPLANT
DRAPE COLUMN DVNC XI (DISPOSABLE) ×1 IMPLANT
DRAPE DA VINCI XI ARM (DISPOSABLE) ×3
DRAPE DA VINCI XI COLUMN (DISPOSABLE) ×1
ELECT CAUTERY BLADE 6.4 (BLADE) ×2 IMPLANT
ELECT REM PT RETURN 9FT ADLT (ELECTROSURGICAL) ×2
ELECTRODE REM PT RTRN 9FT ADLT (ELECTROSURGICAL) ×1 IMPLANT
GAUZE 4X4 16PLY ~~LOC~~+RFID DBL (SPONGE) ×2 IMPLANT
GLOVE SURG SYN 6.5 ES PF (GLOVE) ×10 IMPLANT
GLOVE SURG UNDER POLY LF SZ7 (GLOVE) ×10 IMPLANT
GOWN STRL REUS W/ TWL LRG LVL3 (GOWN DISPOSABLE) ×3 IMPLANT
GOWN STRL REUS W/TWL LRG LVL3 (GOWN DISPOSABLE) ×6
GRASPER SUT TROCAR 14GX15 (MISCELLANEOUS) IMPLANT
IRRIGATOR SUCT 8 DISP DVNC XI (IRRIGATION / IRRIGATOR) IMPLANT
IRRIGATOR SUCTION 8MM XI DISP (IRRIGATION / IRRIGATOR)
IV NS 1000ML (IV SOLUTION)
IV NS 1000ML BAXH (IV SOLUTION) IMPLANT
LABEL OR SOLS (LABEL) ×2 IMPLANT
MANIFOLD NEPTUNE II (INSTRUMENTS) ×2 IMPLANT
MESH PROGRIP HERNIA FLAT 30X15 (Mesh General) ×2 IMPLANT
NEEDLE HYPO 22GX1.5 SAFETY (NEEDLE) ×2 IMPLANT
NEEDLE INSUFFLATION 14GA 120MM (NEEDLE) ×2 IMPLANT
OBTURATOR OPTICAL STANDARD 8MM (TROCAR) ×1
OBTURATOR OPTICAL STND 8 DVNC (TROCAR) ×1
OBTURATOR OPTICALSTD 8 DVNC (TROCAR) ×1 IMPLANT
PACK LAP CHOLECYSTECTOMY (MISCELLANEOUS) ×2 IMPLANT
PENCIL ELECTRO HAND CTR (MISCELLANEOUS) ×2 IMPLANT
SEAL CANN UNIV 5-8 DVNC XI (MISCELLANEOUS) ×2 IMPLANT
SEAL XI 5MM-8MM UNIVERSAL (MISCELLANEOUS) ×2
SET TUBE SMOKE EVAC HIGH FLOW (TUBING) ×2 IMPLANT
SOLUTION ELECTROLUBE (MISCELLANEOUS) ×2 IMPLANT
STAPLER CANNULA SEAL DVNC XI (STAPLE) ×1 IMPLANT
STAPLER CANNULA SEAL XI (STAPLE) ×1
SUT MNCRL 4-0 (SUTURE) ×2
SUT MNCRL 4-0 27XMFL (SUTURE) ×1
SUT MNCRL AB 4-0 PS2 18 (SUTURE) ×2 IMPLANT
SUT STRATAFIX 0 PDS+ CT-2 23 (SUTURE) ×4
SUT V-LOC 90 ABS DVC 3-0 CL (SUTURE) ×4 IMPLANT
SUT VIC AB 3-0 SH 27 (SUTURE) ×2
SUT VIC AB 3-0 SH 27X BRD (SUTURE) ×1 IMPLANT
SUT VICRYL 0 AB UR-6 (SUTURE) ×2 IMPLANT
SUTURE MNCRL 4-0 27XMF (SUTURE) ×1 IMPLANT
SUTURE STRATFX 0 PDS+ CT-2 23 (SUTURE) ×2 IMPLANT
SYR 30ML LL (SYRINGE) ×2 IMPLANT
SYSTEM WECK SHIELD CLOSURE (TROCAR) ×2 IMPLANT
TRAY FOLEY MTR SLVR 16FR STAT (SET/KITS/TRAYS/PACK) ×2 IMPLANT
TROCAR XCEL NON-BLD 5MMX100MML (ENDOMECHANICALS) IMPLANT
WATER STERILE IRR 500ML POUR (IV SOLUTION) ×2 IMPLANT

## 2020-12-22 NOTE — Anesthesia Procedure Notes (Signed)
Procedure Name: Intubation Date/Time: 12/22/2020 2:42 PM Performed by: Reece Agar, CRNA Pre-anesthesia Checklist: Patient identified, Emergency Drugs available, Suction available and Patient being monitored Patient Re-evaluated:Patient Re-evaluated prior to induction Oxygen Delivery Method: Circle system utilized Preoxygenation: Pre-oxygenation with 100% oxygen Induction Type: IV induction Ventilation: Mask ventilation without difficulty Laryngoscope Size: McGraph and 3 Grade View: Grade I Tube type: Oral Tube size: 7.0 mm Number of attempts: 1 Airway Equipment and Method: Stylet and Oral airway Placement Confirmation: ETT inserted through vocal cords under direct vision, positive ETCO2 and breath sounds checked- equal and bilateral Secured at: 21 cm Tube secured with: Tape Dental Injury: Teeth and Oropharynx as per pre-operative assessment

## 2020-12-22 NOTE — Anesthesia Preprocedure Evaluation (Addendum)
Anesthesia Evaluation  Patient identified by MRN, date of birth, ID band Patient awake    Reviewed: Allergy & Precautions, NPO status , Patient's Chart, lab work & pertinent test results, reviewed documented beta blocker date and time   History of Anesthesia Complications (+) PONV and history of anesthetic complications  Airway Mallampati: III  TM Distance: <3 FB Neck ROM: Full    Dental  (+) Upper Dentures, Partial Lower, Missing,    Pulmonary shortness of breath and with exertion, Current SmokerPatient did not abstain from smoking.,    breath sounds clear to auscultation       Cardiovascular Exercise Tolerance: Good hypertension, Pt. on medications (-) angina(-) DOE  Rhythm:Regular Rate:Normal     Neuro/Psych Anxiety    GI/Hepatic Neg liver ROS, GERD  Medicated and Controlled,Ventral hernia   Endo/Other  negative endocrine ROS  Renal/GU negative Renal ROS     Musculoskeletal  (+) Arthritis , Osteoarthritis,    Abdominal Normal abdominal exam  (+)   Peds  Hematology negative hematology ROS (+)   Anesthesia Other Findings   Reproductive/Obstetrics                            Anesthesia Physical  Anesthesia Plan  ASA: II  Anesthesia Plan: General   Post-op Pain Management:    Induction: Intravenous  PONV Risk Score and Plan: 3 and Treatment may vary due to age or medical condition, Ondansetron, Midazolam and Dexamethasone  Airway Management Planned: Oral ETT  Additional Equipment:   Intra-op Plan:   Post-operative Plan: Extubation in OR  Informed Consent: I have reviewed the patients History and Physical, chart, labs and discussed the procedure including the risks, benefits and alternatives for the proposed anesthesia with the patient or authorized representative who has indicated his/her understanding and acceptance.     Dental advisory given  Plan Discussed with: CRNA  and Anesthesiologist  Anesthesia Plan Comments:        Anesthesia Quick Evaluation

## 2020-12-22 NOTE — Anesthesia Postprocedure Evaluation (Signed)
Anesthesia Post Note  Patient: Caitlin Oneal  Procedure(s) Performed: XI ROBOTIC ASSISTED VENTRAL HERNIA (Abdomen)  Patient location during evaluation: PACU Anesthesia Type: General Level of consciousness: awake and alert Pain management: pain level controlled Vital Signs Assessment: post-procedure vital signs reviewed and stable Respiratory status: spontaneous breathing, nonlabored ventilation, respiratory function stable and patient connected to nasal cannula oxygen Cardiovascular status: blood pressure returned to baseline and stable Postop Assessment: no apparent nausea or vomiting Anesthetic complications: no   No notable events documented.   Last Vitals:  Vitals:   12/22/20 1753 12/22/20 1809  BP: 140/64 140/64  Pulse: 87 81  Resp: 16 12  Temp:  36.6 C  SpO2: 94% 94%    Last Pain:  Vitals:   12/22/20 1809  TempSrc: Temporal  PainSc: 0-No pain                 Cleda Mccreedy Saloni Lablanc

## 2020-12-22 NOTE — Interval H&P Note (Signed)
No change. Ok to proceed

## 2020-12-22 NOTE — Discharge Instructions (Addendum)
Hernia repair, Care After This sheet gives you information about how to care for yourself after your procedure. Your health care provider may also give you more specific instructions. If you have problems or questions, contact your health care provider. What can I expect after the procedure? After your procedure, it is common to have the following: Pain in your abdomen, especially in the incision areas. You will be given medicine to control the pain. Tiredness. This is a normal part of the recovery process. Your energy level will return to normal over the next several weeks. Changes in your bowel movements, such as constipation or needing to go more often. Talk with your health care provider about how to manage this. Follow these instructions at home: Medicines  tylenol and advil as needed for discomfort.  Please alternate between the two every four hours as needed for pain.    Use narcotics, if prescribed, only when tylenol and motrin is not enough to control pain.  325-650mg every 8hrs to max of 3000mg/24hrs (including the 325mg in every norco dose) for the tylenol.    Advil up to 800mg per dose every 8hrs as needed for pain.   PLEASE RECORD NUMBER OF PILLS TAKEN UNTIL NEXT FOLLOW UP APPT.  THIS WILL HELP DETERMINE HOW READY YOU ARE TO BE RELEASED FROM ANY ACTIVITY RESTRICTIONS Do not drive or use heavy machinery while taking prescription pain medicine. Do not drink alcohol while taking prescription pain medicine.  Incision care    Follow instructions from your health care provider about how to take care of your incision areas. Make sure you: Keep your incisions clean and dry. Wash your hands with soap and water before and after applying medicine to the areas, and before and after changing your bandage (dressing). If soap and water are not available, use hand sanitizer. Change your dressing as told by your health care provider. Leave stitches (sutures), skin glue, or adhesive strips in  place. These skin closures may need to stay in place for 2 weeks or longer. If adhesive strip edges start to loosen and curl up, you may trim the loose edges. Do not remove adhesive strips completely unless your health care provider tells you to do that. Do not wear tight clothing over the incisions. Tight clothing may rub and irritate the incision areas, which may cause the incisions to open. Do not take baths, swim, or use a hot tub until your health care provider approves. OK TO SHOWER IN 24HRS.   Check your incision area every day for signs of infection. Check for: More redness, swelling, or pain. More fluid or blood. Warmth. Pus or a bad smell. Activity Avoid lifting anything that is heavier than 10 lb (4.5 kg) for 2 weeks or until your health care provider says it is okay. No pushing/pulling greater than 30lbs You may resume normal activities as told by your health care provider. Ask your health care provider what activities are safe for you. Take rest breaks during the day as needed. Eating and drinking Follow instructions from your health care provider about what you can eat after surgery. To prevent or treat constipation while you are taking prescription pain medicine, your health care provider may recommend that you: Drink enough fluid to keep your urine clear or pale yellow. Take over-the-counter or prescription medicines. Eat foods that are high in fiber, such as fresh fruits and vegetables, whole grains, and beans. Limit foods that are high in fat and processed sugars, such as fried and   sweet foods. General instructions Ask your health care provider when you will need an appointment to get your sutures or staples removed. Keep all follow-up visits as told by your health care provider. This is important. Contact a health care provider if: You have more redness, swelling, or pain around your incisions. You have more fluid or blood coming from the incisions. Your incisions feel  warm to the touch. You have pus or a bad smell coming from your incisions or your dressing. You have a fever. You have an incision that breaks open (edges not staying together) after sutures or staples have been removed. You develop a rash. You have chest pain or difficulty breathing. You have pain or swelling in your legs. You feel light-headed or you faint. Your abdomen swells (becomes distended). You have nausea or vomiting. You have blood in your stool (feces). This information is not intended to replace advice given to you by your health care provider. Make sure you discuss any questions you have with your health care provider. Document Released: 07/21/2004 Document Revised: 09/20/2017 Document Reviewed: 10/03/2015 Elsevier Interactive Patient Education  2019 [employer]   AMBULATORY SURGERY  DISCHARGE INSTRUCTIONS   The drugs that you were given will stay in your system until tomorrow so for the next 24 hours you should not:  Drive an automobile Make any legal decisions Drink any alcoholic beverage   You may resume regular meals tomorrow.  Today it is better to start with liquids and gradually work up to solid foods.  You may eat anything you prefer, but it is better to start with liquids, then soup and crackers, and gradually work up to solid foods.   Please notify your doctor immediately if you have any unusual bleeding, trouble breathing, redness and pain at the surgery site, drainage, fever, or pain not relieved by medication.    Additional Instructions:        Please contact your physician with any problems or Same Day Surgery at [phone], Monday through Friday 6 am to 4 pm, or [facility] [place] [facility] number at [phone].  

## 2020-12-22 NOTE — Transfer of Care (Signed)
Immediate Anesthesia Transfer of Care Note  Patient: Caitlin Oneal  Procedure(s) Performed: XI ROBOTIC ASSISTED VENTRAL HERNIA (Abdomen)  Patient Location: PACU  Anesthesia Type:General  Level of Consciousness: drowsy  Airway & Oxygen Therapy: Patient Spontanous Breathing and Patient connected to face mask oxygen  Post-op Assessment: Report given to RN and Post -op Vital signs reviewed and stable  Post vital signs: Reviewed and stable  Last Vitals:  Vitals Value Taken Time  BP 155/75 12/22/20 1704  Temp 98.13f   Pulse 85 12/22/20 1706  Resp 15 12/22/20 1706  SpO2 100 % 12/22/20 1706  Vitals shown include unvalidated device data.  Last Pain:  Vitals:   12/22/20 1342  TempSrc: Temporal  PainSc: 0-No pain         Complications: No notable events documented.

## 2020-12-23 ENCOUNTER — Encounter: Payer: Self-pay | Admitting: Surgery

## 2020-12-23 NOTE — Op Note (Signed)
Preoperative diagnosis: ventral hernia, reducible  Postoperative diagnosis: same  Procedure: Robotic assisted laparoscopic ventral  hernia repair with mesh  Anesthesia: general  Surgeon: Sung Amabile  Wound Classification: Clean  Specimen: none  Complications: None  Estimated Blood Loss: 21ml  Indications:see HPI  Findings: ventral hernia defect measuring 2.3cm x 2.3cm 4. Tension free repair achieved with ProGrip mesh and suture 5. Adequate hemostasis  Description of procedure: The patient was brought to the operating room and general anesthesia was induced. A time-out was completed verifying correct patient, procedure, site, positioning, and implant(s) and/or special equipment prior to beginning this procedure. Antibiotics were administered prior to making the incision. SCDs placed. The anterior abdominal wall was prepped and draped in the standard sterile fashion.   Palmer's point chosen for entry.  Veress needle placed and abdomen insufflated to 15cm without any dramatic increase in pressure.  Needle removed and optiview technique used to place 61mm port in left lateral aspect.  No injury noted during placement.  One additional 72mm and 33mm then placed under direct visualization to each side of original 83mm port.   Xi robot then docked into place.  Ventral hernia defect measuring 2.3x2.3 cm was noted with omental contents.  The omental contents were reduced.  Preperitoneal plane was entered by making a incision along the right lateral aspect of the peritoneum.  This flap was carried across the abdomen to the other side of the defect, reducing all hernia contents and preperitoneal lipoma within the hernia defect.  Portions of the flap entereed the retrorectus space due to extremely thin peritoneum.  Small amounts of bleeding was controlled with cautery.  Once adequate exposure of the defect and adequate space was created to place the mesh, insufflation dropped to 24mm and transfacial suture  with 0 stratafix used to primarily close defect under minimal tension.    Defects created within the posterior fascia while developing plane was closed with 0 stratafix. ProGrip mesh cut to size with adequate overlap around the defect edges was placed within the abdominal cavity through 15mm port and secured to the abdominal wall centered over the defect. The peritoneal flap was then closed with a running 3-0 V lock.  Robot was undocked.  The 33mm cannula was removed and port site was closed using Efx Shield and 0 vicryl, ensuring no bowels were injured during this process.  Abdomen then desufflated while camera within abdomen to ensure no signs of new bleed prior to removing camera and rest of ports completely.  All skin incisions closed with runninrg 4-0 Monocryl in a subcuticular fashion.  All wounds then dressed with Dermabond.  Patient was then successfully awakened and transferred to PACU in stable condition.  At the end of the procedure sponge and instrument counts were correct.

## 2021-06-14 ENCOUNTER — Other Ambulatory Visit: Payer: Self-pay | Admitting: Internal Medicine

## 2021-08-01 ENCOUNTER — Other Ambulatory Visit: Payer: Self-pay | Admitting: Nurse Practitioner

## 2021-08-01 ENCOUNTER — Other Ambulatory Visit: Payer: Self-pay

## 2021-08-01 DIAGNOSIS — Z1231 Encounter for screening mammogram for malignant neoplasm of breast: Secondary | ICD-10-CM

## 2021-12-15 ENCOUNTER — Other Ambulatory Visit: Payer: Self-pay | Admitting: Internal Medicine

## 2022-08-15 ENCOUNTER — Other Ambulatory Visit: Payer: Self-pay | Admitting: Nurse Practitioner

## 2022-08-15 DIAGNOSIS — Z1231 Encounter for screening mammogram for malignant neoplasm of breast: Secondary | ICD-10-CM

## 2022-10-25 ENCOUNTER — Ambulatory Visit
Admission: RE | Admit: 2022-10-25 | Discharge: 2022-10-25 | Disposition: A | Discharge status: Home / Self Care | Payer: 59 | Source: Ambulatory Visit | Attending: Nurse Practitioner | Admitting: Nurse Practitioner

## 2022-10-25 DIAGNOSIS — Z1231 Encounter for screening mammogram for malignant neoplasm of breast: Secondary | ICD-10-CM | POA: Insufficient documentation

## 2023-08-21 ENCOUNTER — Other Ambulatory Visit: Payer: Self-pay | Admitting: Nurse Practitioner

## 2023-08-21 DIAGNOSIS — Z1231 Encounter for screening mammogram for malignant neoplasm of breast: Secondary | ICD-10-CM

## 2023-10-02 IMAGING — MR MR ABDOMEN WO/W CM
18 of 21 series · 43 of 48 positions shown · IV contrast (gadavist)
Comparison: 11/23/2020

CLINICAL DATA: Cystic lesion adjacent the pancreas on prior CT.
Evaluate for exophytic pancreatic mass.

EXAM:
MRI ABDOMEN WITHOUT AND WITH CONTRAST
TECHNIQUE: Multiplanar multisequence MR imaging of the abdomen was performed
both before and after the administration of intravenous contrast.
CONTRAST:  7mL GADAVIST GADOBUTROL 1 MMOL/ML IV SOLN

[Series 3: cor haste · coronal · 6.0mm · 1.19mm/px · 1 of 32 slices shown]
[im 1/32]
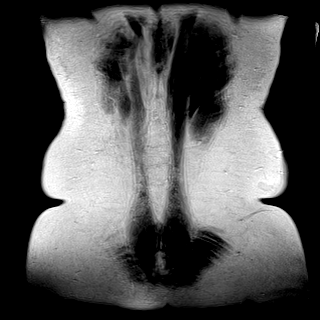

[Series 4: ax haste · axial · 6.0mm · 1.19mm/px · 1 of 34 slices shown]
[im 1/34]
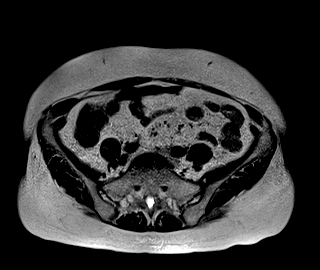

[Series 7: T2 fat-sat · axial · 6.0mm · 1.19mm/px · 1 of 34 slices shown]
[im 1/34]
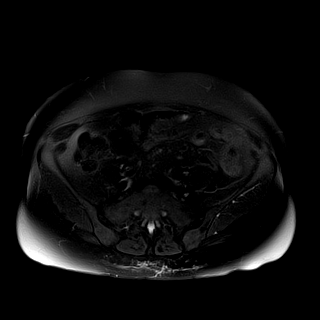

[Series 8: ax dwi_tracew · axial · 6.0mm · 1.42mm/px · z∈[-203,+35]mm · 3 of 102 slices shown]
[im 1/102]
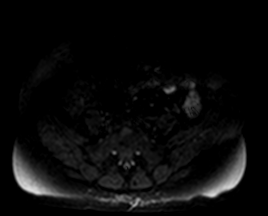
[im 51/102]
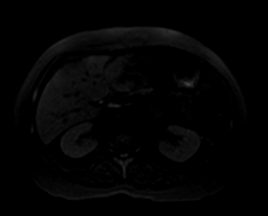
[im 102/102]
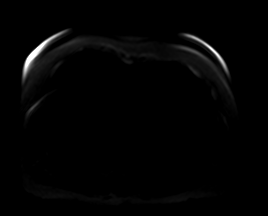

[Series 9: ax dwi_adc · axial · 6.0mm · 1.42mm/px · 1 of 34 slices shown]
[im 1/34]
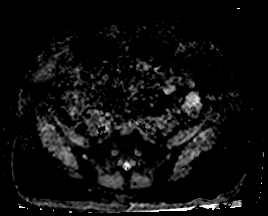

[Series 10: in & out · axial · 3.0mm · 1.19mm/px · z∈[-191,+22]mm · 3 of 72 slices shown (1 of 2)]
[im 1/72]
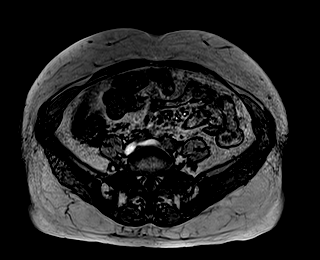
[im 36/72]
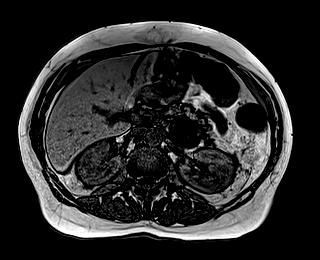
[im 72/72]
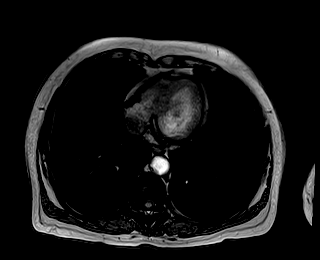

[Series 11: in & out · axial · 3.0mm · 1.19mm/px · z∈[-191,+22]mm · 3 of 72 slices shown (2 of 2)]
[im 1/72]
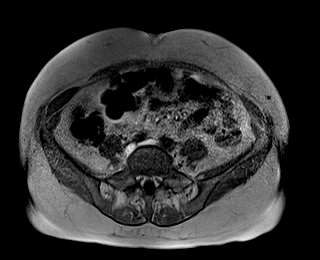
[im 36/72]
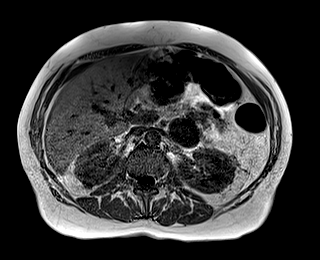
[im 72/72]
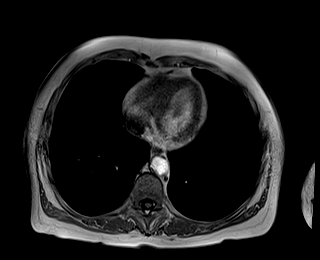

[Series 12: bSSFP · axial · 6.0mm · 0.74mm/px · 1 of 34 slices shown]
[im 1/34]
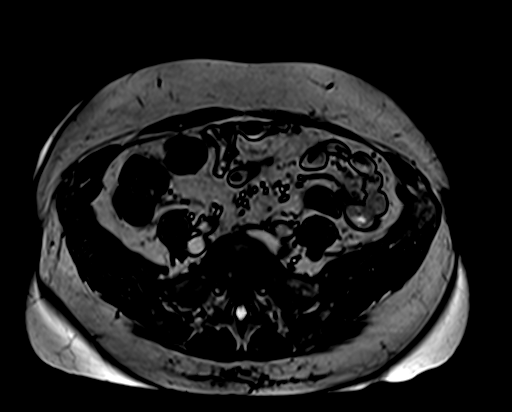

[Series 16: T1 dynamic fat-sat · axial · non-contrast · 3.0mm · 1.19mm/px · z∈[-191,+22]mm · 3 of 72 slices shown (1 of 5)]
[im 1/72]
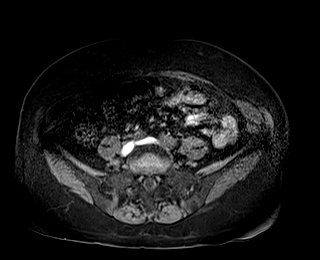
[im 36/72]
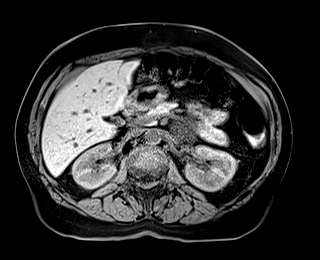
[im 72/72]
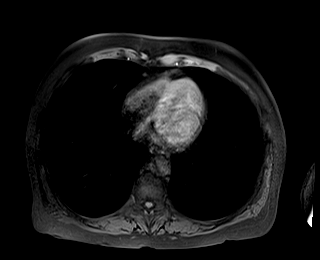

[Series 17: T1 dynamic fat-sat post-contrast · axial · 3.0mm · 1.19mm/px · z∈[-191,+22]mm · 3 of 72 slices shown (1 of 4)]
[im 1/72]
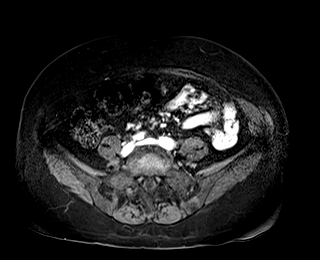
[im 36/72]
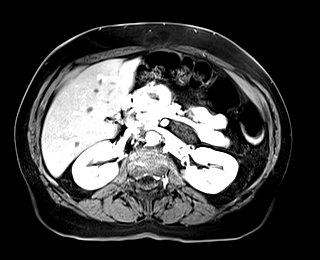
[im 72/72]
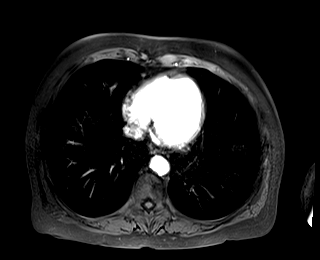

[Series 18: T1 dynamic fat-sat · axial · 3.0mm · 1.19mm/px · z∈[-191,+22]mm · 3 of 72 slices shown (2 of 5)]
[im 1/72]
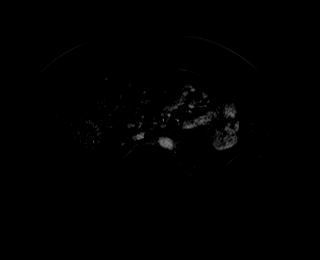
[im 36/72]
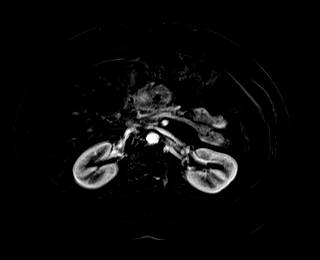
[im 72/72]
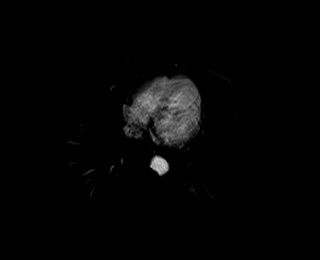

[Series 19: T1 dynamic fat-sat post-contrast · axial · 3.0mm · 1.19mm/px · z∈[-191,+22]mm · 3 of 72 slices shown (2 of 4)]
[im 1/72]
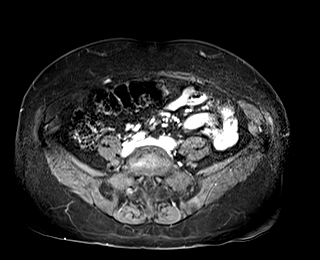
[im 36/72]
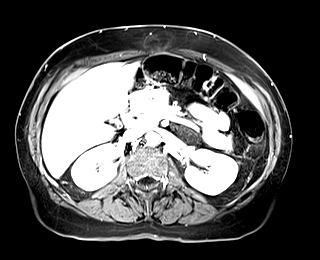
[im 72/72]
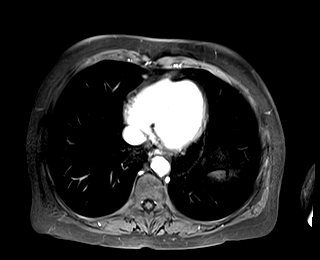

[Series 20: T1 dynamic fat-sat · axial · 3.0mm · 1.19mm/px · z∈[-191,+22]mm · 3 of 72 slices shown (3 of 5)]
[im 1/72]
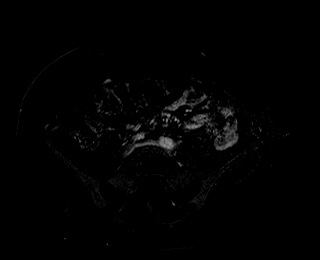
[im 36/72]
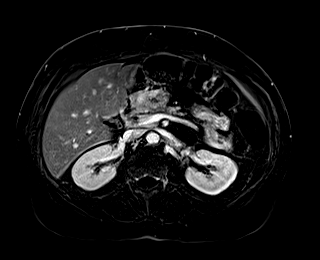
[im 72/72]
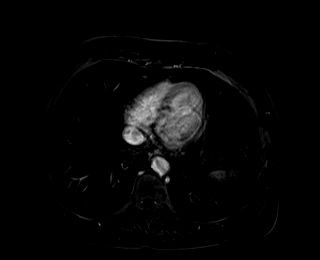

[Series 21: T1 dynamic fat-sat post-contrast · axial · 3.0mm · 1.19mm/px · z∈[-191,+22]mm · 3 of 72 slices shown (3 of 4)]
[im 1/72]
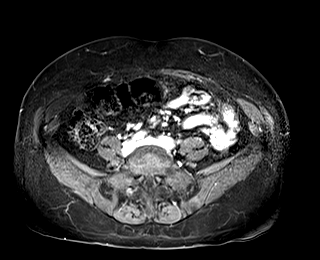
[im 36/72]
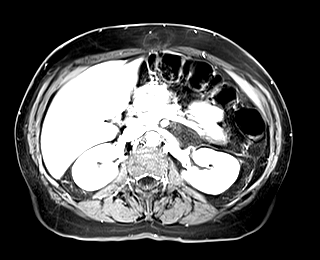
[im 72/72]
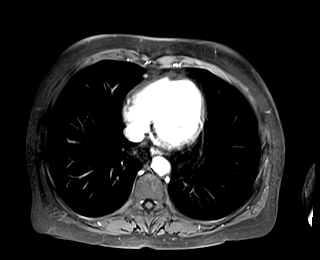

[Series 22: T1 dynamic fat-sat · axial · 3.0mm · 1.19mm/px · z∈[-191,+22]mm · 3 of 72 slices shown (4 of 5)]
[im 1/72]
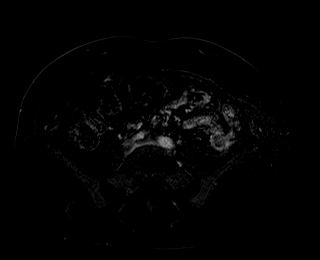
[im 36/72]
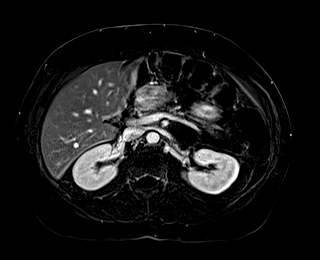
[im 72/72]
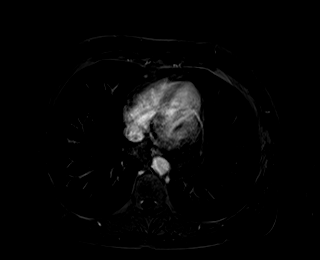

[Series 23: T1 dynamic post-contrast · coronal · 4.0mm · 1.31mm/px · 2 of 60 slices shown]
[im 1/60]
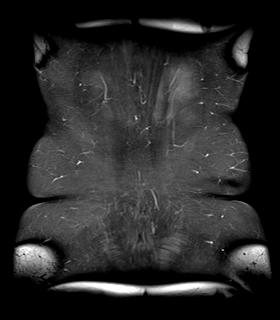
[im 60/60]
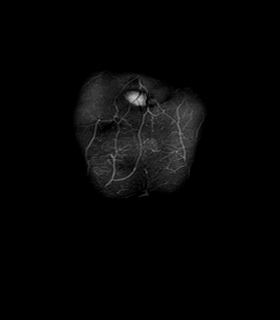

[Series 24: T1 dynamic fat-sat post-contrast · axial · 3.0mm · 1.19mm/px · z∈[-191,+22]mm · 3 of 72 slices shown (4 of 4)]
[im 1/72]
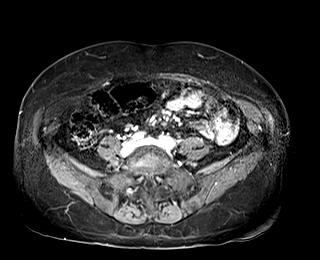
[im 36/72]
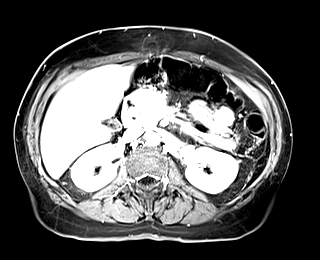
[im 72/72]
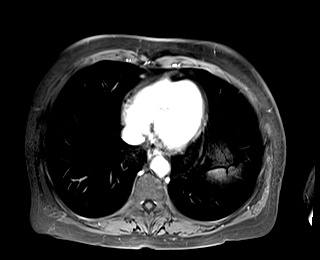

[Series 25: T1 dynamic fat-sat · axial · 3.0mm · 1.19mm/px · z∈[-191,+22]mm · 3 of 72 slices shown (5 of 5)]
[im 1/72]
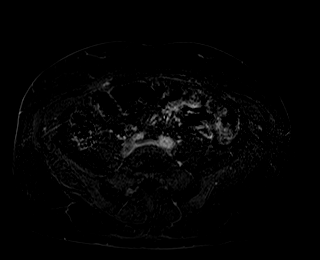
[im 36/72]
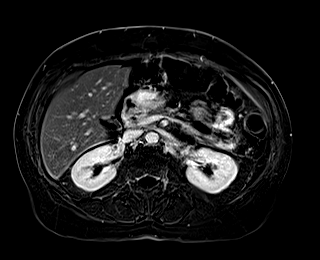
[im 72/72]
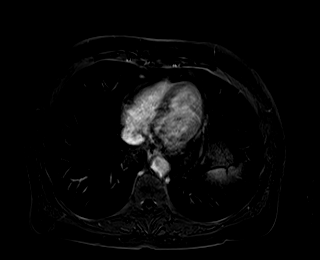

[43 of 48 positions shown; findings below may reference images not displayed]

FINDINGS: Lower chest: Normal heart size without pericardial or pleural
effusion.

Hepatobiliary: Normal liver. Small dependent gallstones without
acute cholecystitis or biliary duct dilatation.

Pancreas: No pancreatic duct dilatation or acute inflammation.
Corresponding to the CT abnormality, position cephalad and posterior
to the pancreatic body, is a simple cystic lesion including at 5.1 x
3.8 by 6.4 cm on [DATE] and [DATE]. No pancreatic origin identified. No
post-contrast enhancement. No restricted diffusion, including on
[DATE].

Spleen:  Normal in size, without focal abnormality.

Adrenals/Urinary Tract: Normal adrenal glands. Normal kidneys,
without hydronephrosis.

Stomach/Bowel: The stomach is underdistended proximally, but appears
thick walled including on [DATE]. Again identified is a right
paramidline ventral abdominal wall hernia containing nonobstructive
transverse colon. Normal small bowel caliber.

Vascular/Lymphatic: Aortic atherosclerosis. No retroperitoneal or
retrocrural adenopathy.

Other:  No ascites.

Musculoskeletal: No acute osseous abnormality.
IMPRESSION: 1. Simple cystic lesion within the abdominal retroperitoneum,
favored to be separate from the pancreas. Favor benign lymphangioma
or GI duplication cyst. A peripancreatic cystic lesion such as a
pseudocyst could look similar. Indolent cystic neoplasm exophytic
off the pancreas felt unlikely. Consider pre and postcontrast CT or
MRI follow-up at 6 months to confirm stability of this appearance.
Alternatively, this may be amenable to ultrasound visualization and
surveillance.
2. Gastric wall thickening, suspicious for gastritis.
3. Ventral abdominal wall hernia containing nonobstructive
transverse colon, as before.
4.  Aortic Atherosclerosis (2O31L-HAS.S).
5. Cholelithiasis

## 2024-01-29 ENCOUNTER — Ambulatory Visit

## 2024-03-02 ENCOUNTER — Ambulatory Visit: Payer: Self-pay
